# Patient Record
Sex: Male | Born: 1935 | Race: White | Hispanic: No | Marital: Married | Smoking: Never smoker
Health system: Southern US, Community
[De-identification: ages and names within clinical notes are randomized; demographics above are authoritative.]

## PROBLEM LIST (undated history)

## (undated) DIAGNOSIS — C801 Malignant (primary) neoplasm, unspecified: Secondary | ICD-10-CM

## (undated) DIAGNOSIS — Z83719 Family history of colon polyps, unspecified: Secondary | ICD-10-CM

## (undated) DIAGNOSIS — H4040X1 Glaucoma secondary to eye inflammation, unspecified eye, mild stage: Secondary | ICD-10-CM

## (undated) DIAGNOSIS — K219 Gastro-esophageal reflux disease without esophagitis: Secondary | ICD-10-CM

## (undated) DIAGNOSIS — H25019 Cortical age-related cataract, unspecified eye: Secondary | ICD-10-CM

## (undated) DIAGNOSIS — Z8371 Family history of colonic polyps: Secondary | ICD-10-CM

## (undated) DIAGNOSIS — I10 Essential (primary) hypertension: Secondary | ICD-10-CM

## (undated) DIAGNOSIS — E785 Hyperlipidemia, unspecified: Secondary | ICD-10-CM

## (undated) DIAGNOSIS — R7303 Prediabetes: Secondary | ICD-10-CM

## (undated) HISTORY — PX: EYE SURGERY: SHX253

---

## 2004-06-20 ENCOUNTER — Ambulatory Visit: Payer: Self-pay

## 2012-11-14 ENCOUNTER — Ambulatory Visit: Payer: Self-pay | Admitting: Internal Medicine

## 2014-01-27 ENCOUNTER — Ambulatory Visit: Payer: Self-pay | Admitting: General Practice

## 2014-01-27 LAB — BASIC METABOLIC PANEL
ANION GAP: 3 — AB (ref 7–16)
BUN: 16 mg/dL (ref 7–18)
CALCIUM: 8.8 mg/dL (ref 8.5–10.1)
CHLORIDE: 107 mmol/L (ref 98–107)
Co2: 32 mmol/L (ref 21–32)
Creatinine: 1.08 mg/dL (ref 0.60–1.30)
EGFR (African American): >60
EGFR (Non-African Amer.): >60
Glucose: 101 mg/dL — ABNORMAL HIGH (ref 65–99)
Osmolality: 284 (ref 275–301)
Potassium: 4.2 mmol/L (ref 3.5–5.1)
SODIUM: 142 mmol/L (ref 136–145)

## 2014-01-27 LAB — CBC
HCT: 43.9 % (ref 40.0–52.0)
HGB: 14.3 g/dL (ref 13.0–18.0)
MCH: 33 pg (ref 26.0–34.0)
MCHC: 32.6 g/dL (ref 32.0–36.0)
MCV: 101 fL — AB (ref 80–100)
PLATELETS: 248 10*3/uL (ref 150–440)
RBC: 4.33 10*6/uL — AB (ref 4.40–5.90)
RDW: 13.6 % (ref 11.5–14.5)
WBC: 6.8 10*3/uL (ref 3.8–10.6)

## 2014-01-27 LAB — URINALYSIS, COMPLETE
BILIRUBIN, UR: NEGATIVE
BLOOD: NEGATIVE
Bacteria: NONE SEEN
Glucose,UR: NEGATIVE mg/dL (ref 0–75)
Ketone: NEGATIVE
Leukocyte Esterase: NEGATIVE
NITRITE: NEGATIVE
Ph: 7 (ref 4.5–8.0)
Protein: NEGATIVE
SPECIFIC GRAVITY: 1.013 (ref 1.003–1.030)
Squamous Epithelial: <1

## 2014-01-27 LAB — MRSA PCR SCREENING

## 2014-01-27 LAB — APTT: Activated PTT: 29.7 secs (ref 23.6–35.9)

## 2014-01-27 LAB — PROTIME-INR
INR: 1
Prothrombin Time: 13.3 secs (ref 11.5–14.7)

## 2014-01-27 LAB — SEDIMENTATION RATE: ERYTHROCYTE SED RATE: 6 mm/h (ref 0–20)

## 2014-01-28 LAB — URINE CULTURE

## 2014-02-08 ENCOUNTER — Inpatient Hospital Stay: Payer: Self-pay | Admitting: General Practice

## 2014-02-08 HISTORY — PX: JOINT REPLACEMENT: SHX530

## 2014-02-09 LAB — BASIC METABOLIC PANEL
Anion Gap: 6 — ABNORMAL LOW (ref 7–16)
BUN: 10 mg/dL (ref 7–18)
CALCIUM: 7.7 mg/dL — AB (ref 8.5–10.1)
CO2: 28 mmol/L (ref 21–32)
CREATININE: 0.93 mg/dL (ref 0.60–1.30)
Chloride: 105 mmol/L (ref 98–107)
EGFR (Non-African Amer.): >60
GLUCOSE: 111 mg/dL — AB (ref 65–99)
Osmolality: 277 (ref 275–301)
POTASSIUM: 4.2 mmol/L (ref 3.5–5.1)
SODIUM: 139 mmol/L (ref 136–145)

## 2014-02-09 LAB — PLATELET COUNT: PLATELETS: 195 10*3/uL (ref 150–440)

## 2014-02-09 LAB — HEMOGLOBIN: HGB: 10.5 g/dL — AB (ref 13.0–18.0)

## 2014-02-10 LAB — BASIC METABOLIC PANEL
ANION GAP: 8 (ref 7–16)
BUN: 9 mg/dL (ref 7–18)
CHLORIDE: 103 mmol/L (ref 98–107)
CREATININE: 0.86 mg/dL (ref 0.60–1.30)
Calcium, Total: 7.8 mg/dL — ABNORMAL LOW (ref 8.5–10.1)
Co2: 28 mmol/L (ref 21–32)
EGFR (African American): >60
Glucose: 97 mg/dL (ref 65–99)
Osmolality: 276 (ref 275–301)
POTASSIUM: 3.6 mmol/L (ref 3.5–5.1)
Sodium: 139 mmol/L (ref 136–145)

## 2014-02-10 LAB — HEMOGLOBIN: HGB: 9.7 g/dL — ABNORMAL LOW (ref 13.0–18.0)

## 2014-02-12 LAB — PATHOLOGY REPORT

## 2014-02-14 ENCOUNTER — Emergency Department: Payer: Self-pay | Admitting: Emergency Medicine

## 2014-02-15 LAB — COMPREHENSIVE METABOLIC PANEL
Albumin: 2.2 g/dL — ABNORMAL LOW (ref 3.4–5.0)
Alkaline Phosphatase: 67 U/L
Anion Gap: 8 (ref 7–16)
BUN: 12 mg/dL (ref 7–18)
Bilirubin,Total: 0.6 mg/dL (ref 0.2–1.0)
Calcium, Total: 8 mg/dL — ABNORMAL LOW (ref 8.5–10.1)
Chloride: 105 mmol/L (ref 98–107)
Co2: 26 mmol/L (ref 21–32)
Creatinine: 0.89 mg/dL (ref 0.60–1.30)
EGFR (African American): >60
EGFR (Non-African Amer.): >60
Glucose: 107 mg/dL — ABNORMAL HIGH (ref 65–99)
Osmolality: 278 (ref 275–301)
Potassium: 3.8 mmol/L (ref 3.5–5.1)
SGOT(AST): 45 U/L — ABNORMAL HIGH (ref 15–37)
SGPT (ALT): 68 U/L — ABNORMAL HIGH
Sodium: 139 mmol/L (ref 136–145)
Total Protein: 6 g/dL — ABNORMAL LOW (ref 6.4–8.2)

## 2014-02-15 LAB — APTT: Activated PTT: 24.7 secs (ref 23.6–35.9)

## 2014-02-15 LAB — CBC
HCT: 29 % — ABNORMAL LOW (ref 40.0–52.0)
HGB: 9.5 g/dL — ABNORMAL LOW (ref 13.0–18.0)
MCH: 33 pg (ref 26.0–34.0)
MCHC: 32.8 g/dL (ref 32.0–36.0)
MCV: 100 fL (ref 80–100)
Platelet: 391 10*3/uL (ref 150–440)
RBC: 2.89 10*6/uL — AB (ref 4.40–5.90)
RDW: 13.3 % (ref 11.5–14.5)
WBC: 8.1 10*3/uL (ref 3.8–10.6)

## 2014-02-15 LAB — PROTIME-INR
INR: 1.1
PROTHROMBIN TIME: 13.7 s (ref 11.5–14.7)

## 2014-02-15 LAB — TROPONIN I: Troponin-I: <0.02 ng/mL

## 2014-02-16 ENCOUNTER — Encounter: Payer: Self-pay | Admitting: Internal Medicine

## 2014-02-28 ENCOUNTER — Encounter: Payer: Self-pay | Admitting: Internal Medicine

## 2014-03-30 ENCOUNTER — Encounter: Payer: Self-pay | Admitting: Internal Medicine

## 2014-04-30 ENCOUNTER — Encounter: Payer: Self-pay | Admitting: Internal Medicine

## 2014-05-31 ENCOUNTER — Encounter: Payer: Self-pay | Admitting: Internal Medicine

## 2014-06-29 ENCOUNTER — Encounter
Admit: 2014-06-29 | Disposition: A | Discharge status: Home / Self Care | Payer: Self-pay | Attending: Internal Medicine | Admitting: Internal Medicine

## 2014-07-30 ENCOUNTER — Encounter
Admit: 2014-07-30 | Disposition: A | Discharge status: Home / Self Care | Payer: Self-pay | Attending: Internal Medicine | Admitting: Internal Medicine

## 2014-08-21 NOTE — Discharge Summary (Signed)
PATIENT NAME:  Mario Baxter, Mario Baxter MR#:  [id number] DATE OF BIRTH:  12/03/1930  DATE OF ADMISSION:  02/08/2014 DATE OF DISCHARGE:  02/12/2014  ADMITTING DIAGNOSIS: Degenerative arthrosis of right hip.   DISCHARGE DIAGNOSES:  1. Degenerative arthrosis of right hip. 2. Inability to void.   HISTORY OF PRESENT ILLNESS: The patient is an 81 year old gentleman who has been followed at [facility] for progression of right hip pain. The pain was noted to be aggravated with weightbearing activities. He denied any radiation of pain down his leg, also had denied any bowel or bladder dysfunction. He had appreciated some crepitus with his range of motion as well as some decrease in his range of motion of the hip. He had attempted to remain active with an exercise routine involving both walking and swimming.   At the time of surgery, he was not using any ambulatory aid. The pain had progressed to the point that it was significantly interfering with his activities of daily living. X-rays taken in [facility] showed significant narrowing of the cartilage space with bone-on-bone articulation being noted superiorly. Subchondral sclerosis was noted as well as subchondral cyst formation. There was flattening of the superior aspect of the femoral head. He was noted to have osteophyte formation as well.   After discussion of the risks and benefits of surgical intervention, the patient expressed his understanding of the risks and benefits and agreed for plans for surgical intervention.   PROCEDURES PERFORMED:  1. Right total hip arthroplasty.  2. Autologous bone grafting of a large subchondral cyst in the acetabulum.   ANESTHESIA: Spinal.   IMPLANTS UTILIZED: DePuy 18-mm small stature AML femoral stem, 60-mm outer diameter Pinnacle 100 acetabular shell, a +4-mm 10-degree Pinnacle Marathon polyethylene liner, and a 36-mm M-Spec femoral head with a +8.5-mm neck length.   HOSPITAL COURSE: The patient  tolerated the procedure very well. He had no complications. He was then taken to the PACU where he was stabilized and then transferred to the orthopedic floor. The patient began receiving anticoagulation therapy of Lovenox 30 mg subcutaneous every 12 hours per anesthesia and pharmacy protocol. He was fitted with TED stockings bilaterally. These were allowed to be removed 1 hour per 8-hour shift. The patient was also fitted with AVI compression foot pumps bilaterally set at 80 mmHg. His calves have been nontender. There has been no evidence of any DVTs. Negative Homans sign. Heels were elevated off the bed using rolled towels.   The patient has denied any chest pain or shortness of breath. Vital signs have been stable. He has been afebrile. Hemodynamically, he is stable. No transfusions were given. The patient was noted to have inability to void after the catheter was removed. Subsequently, the Foley was reinserted. After some bladder training, this was discontinued and he was able to void along with the addition of Flomax to his pharmacy protocol.   Physical therapy was initiated on day 1 for gait training and transfers. He has done very well. He has been slow, but has progressed very well with no complications. Occupational therapy was also initiated on day 1 for ADLs and assistive devices.   The patient's IV, Foley and Hemovac were discontinued on day 1. As stated already, the Foley had to be reinserted. The wound was changed. There were no signs of any infection upon dressing change.   The patient was discharged to a skilled nursing facility in improved stable condition.   DISCHARGE INSTRUCTIONS: He may weight bear as tolerated. Continue  using rolling walker until cleared by physical therapy to go to a quad cane. He will receive OT and PT. Continue with TED stockings. These are allowed to be removed 1 hour per 8-hour shift. Elevate the heels off the bed. Posterior hip precautions were gone over.  Incentive spirometer q.1 hour while awake; encourage cough, deep breathing q.2 hours while awake. Change dressing as needed. Staples are to be removed on 02/22/2014. Apply benzoin and Steri-Strips. He has a followup appointment with [facility] on November 24 at 1:45. He is to call the clinic sooner if any complications.   DRUG ALLERGIES: No known drug allergies.   MEDICATIONS: Senokot-S 1 tablet b.i.d., multivitamin capsule 1 tablet daily, pantoprazole 40 mg daily, Altace 2.5 mg daily, Zocor 20 mg daily, Flomax 0.4 mg daily after food, Tylenol ES 500 to 1000 mg q.4-6h. p.r.n. for temperatures, Mylanta DS 30 mL q.6 hours p.r.n., Dulcolax suppository 10 mg rectally p.r.n. for constipation without results with Milk of Magnesia, Milk of Magnesia 30 mL b.i.d., Roxicodone 5 to 10 mg every 4 to 6 hours p.r.n. for pain, tramadol 50 to 100 mg every 4 to 6 hours p.r.n. for pain, enema with soapsuds if no results with Milk of Magnesia or Dulcolax.   PAST MEDICAL HISTORY:  1. Hyperlipidemia.  2. Early stage glaucoma.  3. Benign colonic polyps.    ____________________________ Vance Peper, PA [email or web address] D: 02/12/2014 16:32:19 ET T: 02/12/2014 16:52:06 ET JOB#: [id number]  cc: Vance Peper, PA, <Dictator> Savannaha Stonerock PA ELECTRONICALLY SIGNED 02/16/2014 12:35

## 2014-08-21 NOTE — Op Note (Signed)
PATIENT NAME:  Mario Baxter, Mario Baxter MR#:  [id number] DATE OF BIRTH:  Dec 31, 1930  DATE OF PROCEDURE:  02/08/2014  PREOPERATIVE DIAGNOSIS:  Degenerative arthrosis of the right hip.   POSTOPERATIVE DIAGNOSIS:  Degenerative arthrosis of the right hip.  PROCEDURE PERFORMED:   1.  Right total hip arthroplasty.  2.  Autologous bone grafting of large subchondral cyst in the acetabulum.   SURGEON:  Laurice Record. Holley Bouche., MD   ASSISTANT:  Vance Peper, PA (referred to maintain retraction throughout the procedure).   ANESTHESIA:  Spinal.   ESTIMATED BLOOD LOSS:  400 mL.   FLUIDS REPLACED:  1800 mL of crystalloid.   DRAINS:  Two medium drains to Hemovac reservoir.   IMPLANTS UTILIZED:  DePuy 18 mm small-stature AML femoral stem, 60 mm outer diameter Pinnacle 100 acetabular shell, +4 mm 10-degree Pinnacle Marathon polyethylene liner, and a 36 mm M-SPEC femoral head with a +8.5 mm neck length.   INDICATIONS FOR SURGERY:  The patient is an 81 year old gentleman who has been seen for complaints of progressive right hip and groin pain. X-rays demonstrated severe degenerative changes with full-thickness loss of articular cartilage. After discussion of the risks and benefits of surgical intervention, the patient expressed understanding of the risks, benefits, and agreed with plans for surgical intervention.   PROCEDURE IN DETAIL:  The patient was brought to the operating room, and after adequate spinal anesthesia was achieved, the patient was placed in a left lateral decubitus position. Axillary roll was placed, and all bony prominences were well padded. The patient's right hip and leg were cleaned and prepped with alcohol and DuraPrep, draped in the usual sterile fashion. A "timeout" was performed as per usual protocol. A lateral curvilinear incision was made gently curving towards the posterior superior iliac spine. IT band was incised in line with the skin incision, and fibers of the gluteus maximus were split in  line. Piriformis tendon was identified, skeletonized, and incised at its insertion at the proximal femur and reflected posteriorly. In a similar fashion, the short external rotators were incised and reflected posteriorly. A T-type posterior capsulotomy was performed. Prior to dislocation of the femoral head, a threaded Steinmann pin was inserted through a separate stab incision into the pelvis superior to the acetabulum and bent in the form of a stylus so as to assess limb length and hip offset throughout the procedure. The femoral head was then dislocated posteriorly. Inspection of the femoral head demonstrated severe degenerative changes with full-thickness loss of articular cartilage. A femoral neck cut was performed using an oscillating saw. The anterior capsule was elevated off of the femoral neck. Inspection of the acetabulum also demonstrated severe degenerative changes. Remnant of the labrum was excised. The acetabulum was reamed in a sequential fashion up to a 59 mm diameter. This allowed for excellent punctate bleeding bone. There was a large cyst superiorly. Gelatinous material and fibrotic tissue was removed using a curette. Cancellous bone from the femoral head as well as bony reamings were used to pack the lesion. The 59 mm reamer was placed on reverse, and the bone graft was impacted accordingly. A 60 mm outer diameter Pinnacle 100 acetabular component was positioned and impacted into place. Excellent scratch fit was appreciated. Anterior osteophytes were debrided from the acetabulum. A +4 mm neutral polyethylene was initially placed. Attention was then directed to the proximal tibia. Pilot hole for reaming of the proximal femoral canal was created using a high-speed bur. Proximal femoral canal was reamed in a sequential fashion  up to a 17.5 mm diameter. This allowed for approximately 6 cm of scratch fit. The proximal portion of the femur was then prepared using 18 mm aggressive side-biting reamer.  Serial broaches were inserted up to an 18 mm small-stature broach. The calcar region was planed, and trial reduction was performed using first a 5 and subsequently an 8.5 mm neck length. Reasonable good stability was appreciated. However, it was elected to trial with a +4 mm 10-degree trial liner with the high side positioned at approximately the 8 o'clock position. This allowed for exceptional posterior stability. There was good equalization of limb lengths appreciated. Trial components were removed. The acetabular shell was irrigated and suctioned dry. A +4 mm 10-degree Pinnacle Marathon polyethylene liner was positioned and impacted into place with the high side positioned at the 8 o'clock position. Next, an 18 mm small-stature AML femoral component was positioned and impacted into place. Excellent scratch fit was appreciated. Trial reduction was again performed with a +8.5 mm neck length. Again, good equalization of limb lengths and excellent stability was appreciated both anteriorly and posteriorly. The trial hip ball was removed, and the Morse taper was cleaned and dried. A 36 mm outer diameter M-SPEC femoral head with a +8.5 mm neck length was placed in trunnion and impacted into place. The hip was reduced and placed through a range of motion with excellent stability both anteriorly and posteriorly and good equalization of limb lengths. The wound was irrigated with copious amounts of normal saline with antibiotic solution using pulsatile lavage and then suctioned dry. Good hemostasis was appreciated. Two Hemovac drains were brought out through a separate stab incision to be attached to the Hemovac reservoir. Posterior capsulotomy was repaired using #5 Ethibond. The piriformis tendon was reapproximated on the surface of the gluteus medius tendon using #5 Ethibond. IT band was repaired using interrupted sutures of #1 Vicryl. The subcutaneous tissue was approximated in layers using first #0 Vicryl followed by  #2-0 Vicryl. The skin was closed with skin staples. A sterile dressing was applied.   The patient tolerated the procedure well. He was transported to the recovery room in stable condition.   ____________________________ Laurice Record. Holley Bouche., MD [email or web address] D: 02/09/2014 00:56:03 ET T: 02/09/2014 03:23:53 ET JOB#: [id number]  cc: Laurice Record. Holley Bouche., MD, <Dictator> Randol P Holley Bouche MD ELECTRONICALLY SIGNED 02/10/2014 6:51

## 2014-10-29 DEATH — deceased

## 2015-09-16 IMAGING — CT CT HEAD WITHOUT CONTRAST
2 series · 14 of 30 positions shown, 16 images · non-contrast
Comparison: None.

CLINICAL DATA: Patient became dizzy and fell down. Mild swelling
overt right temporal scalp. Initial encounter.

EXAM:
CT HEAD WITHOUT CONTRAST
TECHNIQUE: Contiguous axial images were obtained from the base of the skull
through the vertex without intravenous contrast.

[Series 2: head wo · axial · 0.42mm/px · z∈[-94,+6]mm · 6 of 28 slices shown, 8 images]
[im 4/28  brain]
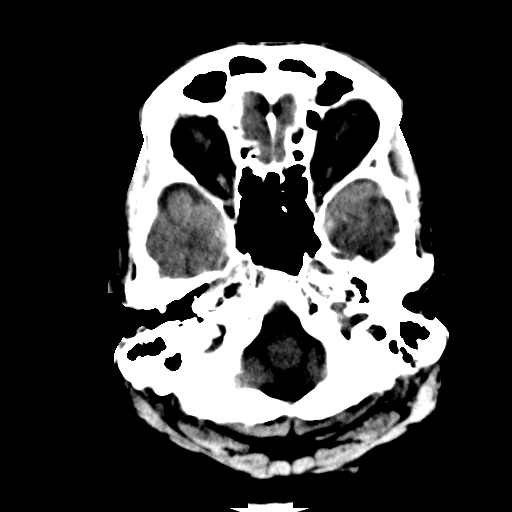
[im 4/28  bone]
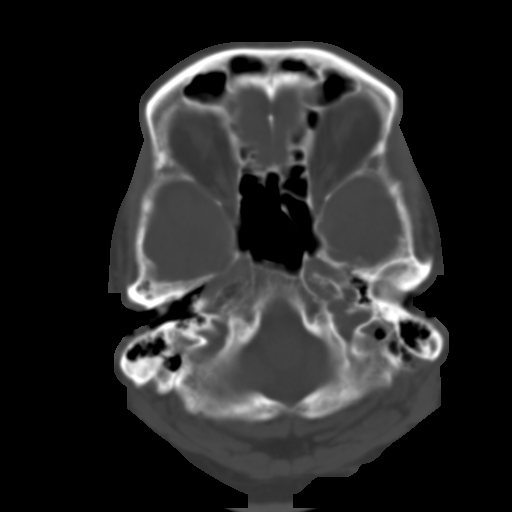
[im 8/28  brain]
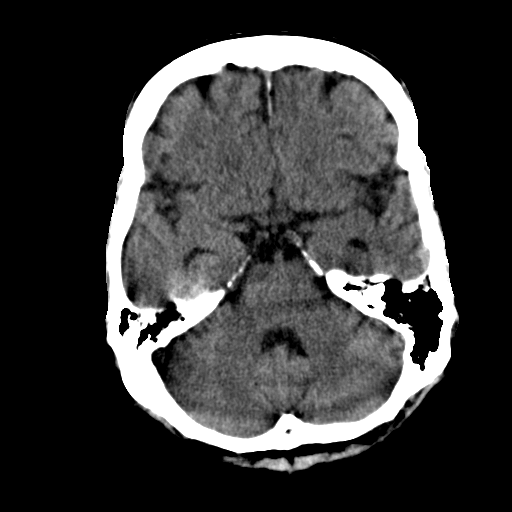
[im 12/28  brain]
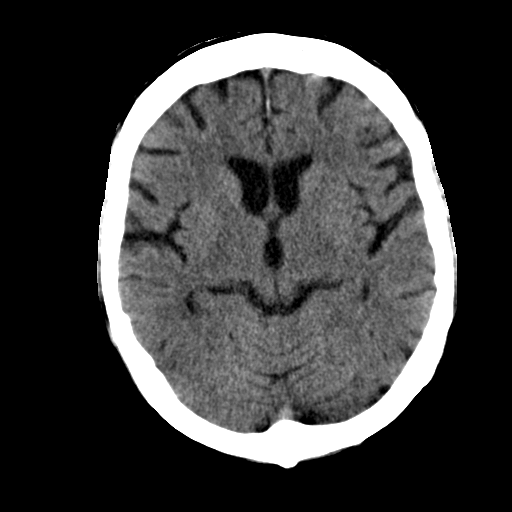
[im 16/28  brain]
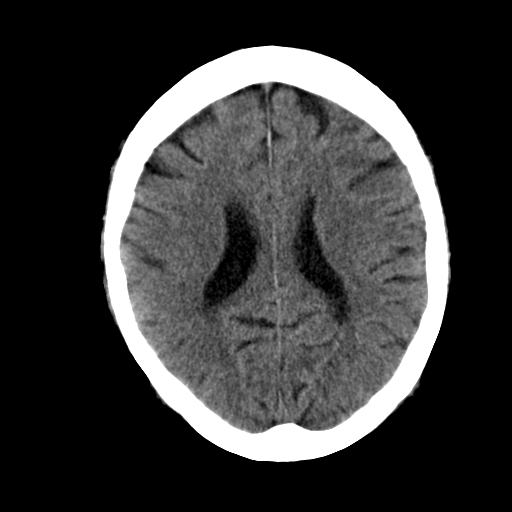
[im 20/28  brain]
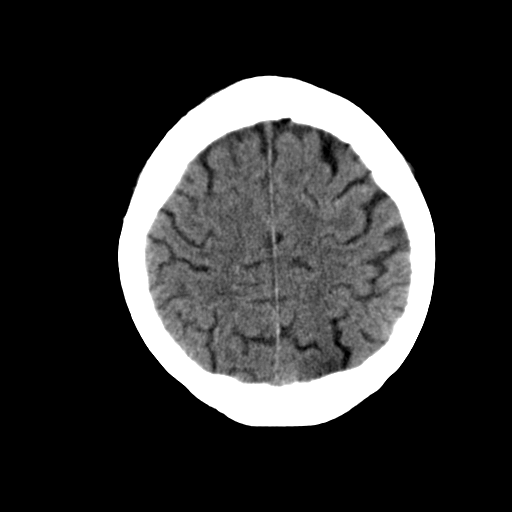
[im 20/28  bone]
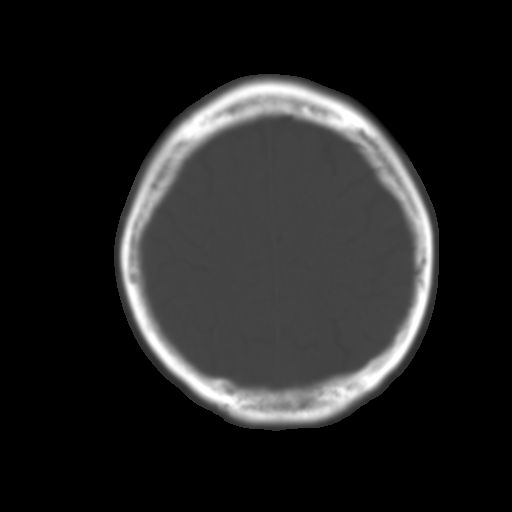
[im 24/28  brain]
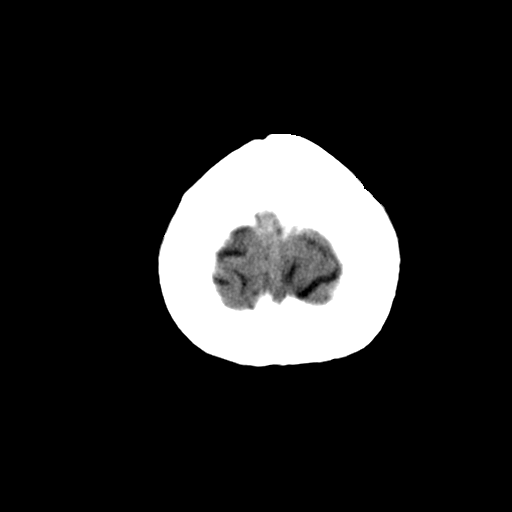

[Series 3: head bone · axial · 0.42mm/px · z∈[-105,+21]mm · 8 of 79 slices shown]
[im 8/79  bone]
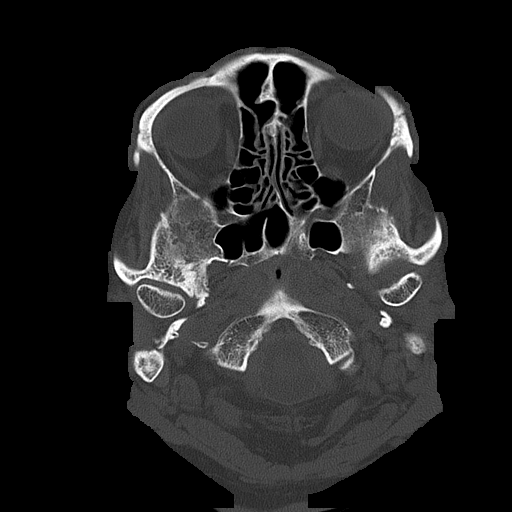
[im 15/79  bone]
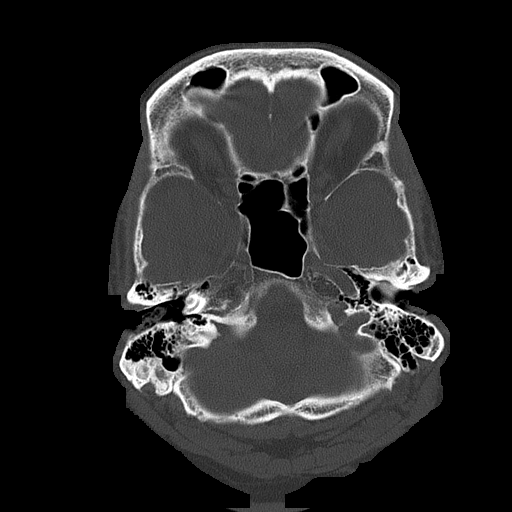
[im 27/79  bone]
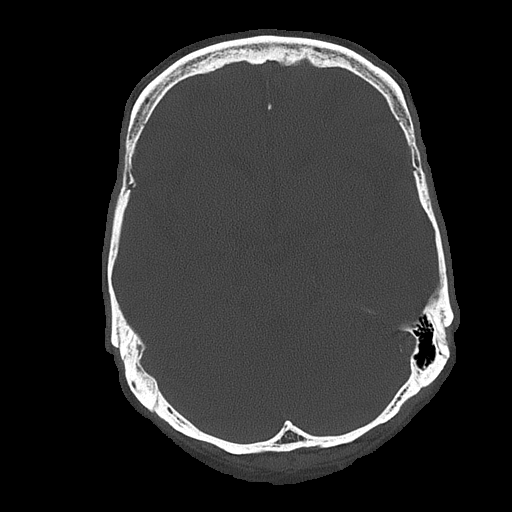
[im 34/79  bone]
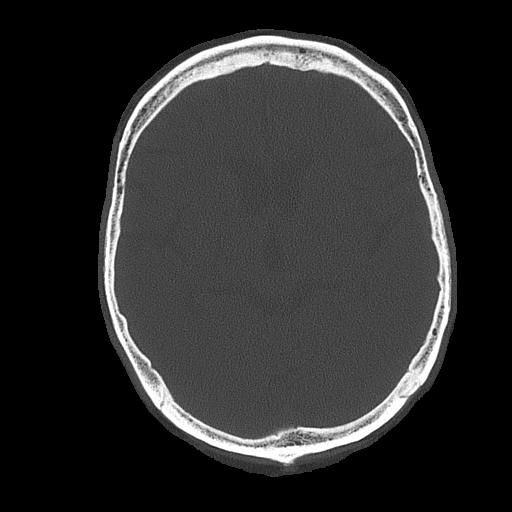
[im 45/79  bone]
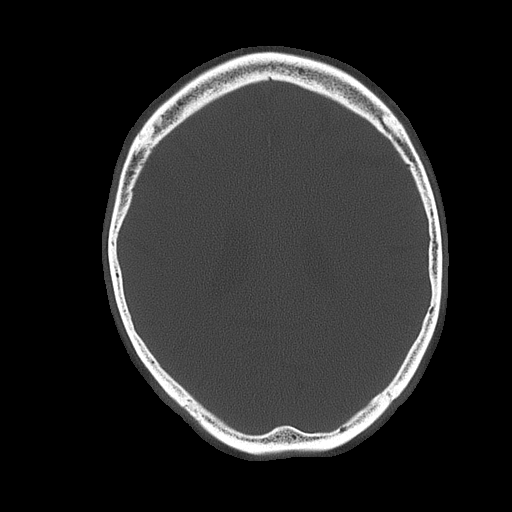
[im 53/79  bone]
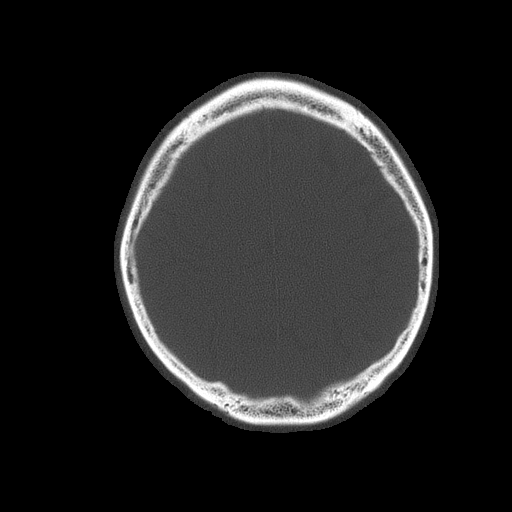
[im 64/79  bone]
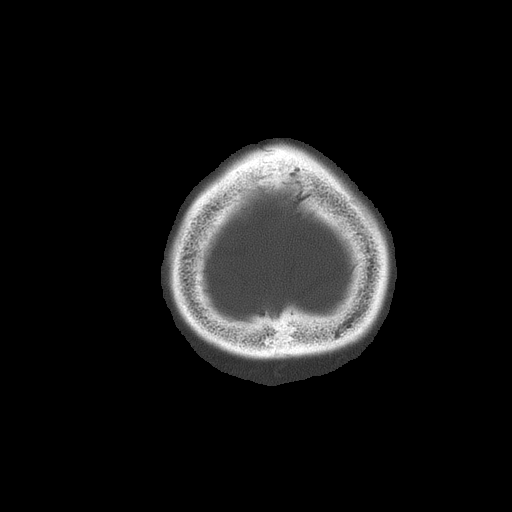
[im 71/79  bone]
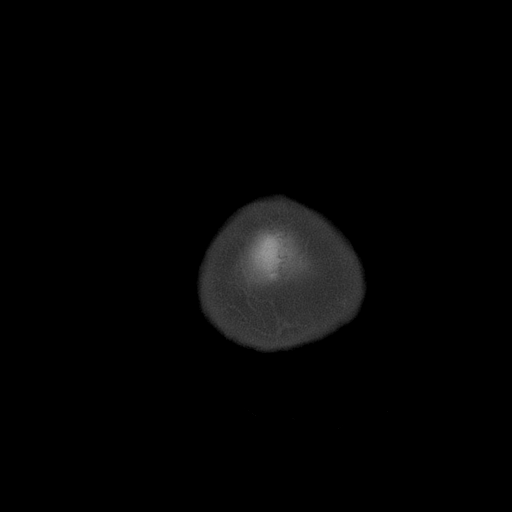

[14 of 30 positions shown; findings below may reference images not displayed]

FINDINGS: There is no evidence for acute hemorrhage, hydrocephalus, mass
lesion, or abnormal extra-axial fluid collection. No definite CT
evidence for acute infarction. Diffuse loss of parenchymal volume is
consistent with atrophy. The visualized paranasal sinuses and
mastoid air cells are clear.
IMPRESSION: No acute intracranial abnormality.

## 2016-09-10 ENCOUNTER — Encounter: Payer: Self-pay | Admitting: *Deleted

## 2016-09-11 ENCOUNTER — Encounter
Admission: RE | Disposition: A | Discharge status: Home / Self Care | Payer: Self-pay | Source: Ambulatory Visit | Attending: Gastroenterology

## 2016-09-11 ENCOUNTER — Ambulatory Visit: Payer: Medicare Other | Admitting: Anesthesiology

## 2016-09-11 ENCOUNTER — Ambulatory Visit
Admission: RE | Admit: 2016-09-11 | Discharge: 2016-09-11 | Disposition: A | Discharge status: Home / Self Care | Payer: Medicare Other | Source: Ambulatory Visit | Attending: Gastroenterology | Admitting: Gastroenterology

## 2016-09-11 ENCOUNTER — Encounter: Payer: Self-pay | Admitting: *Deleted

## 2016-09-11 DIAGNOSIS — D120 Benign neoplasm of cecum: Secondary | ICD-10-CM | POA: Diagnosis not present

## 2016-09-11 DIAGNOSIS — D128 Benign neoplasm of rectum: Secondary | ICD-10-CM | POA: Diagnosis not present

## 2016-09-11 DIAGNOSIS — K224 Dyskinesia of esophagus: Secondary | ICD-10-CM | POA: Insufficient documentation

## 2016-09-11 DIAGNOSIS — D123 Benign neoplasm of transverse colon: Secondary | ICD-10-CM | POA: Insufficient documentation

## 2016-09-11 DIAGNOSIS — E785 Hyperlipidemia, unspecified: Secondary | ICD-10-CM | POA: Diagnosis not present

## 2016-09-11 DIAGNOSIS — K319 Disease of stomach and duodenum, unspecified: Secondary | ICD-10-CM | POA: Diagnosis not present

## 2016-09-11 DIAGNOSIS — K449 Diaphragmatic hernia without obstruction or gangrene: Secondary | ICD-10-CM | POA: Diagnosis not present

## 2016-09-11 DIAGNOSIS — K621 Rectal polyp: Secondary | ICD-10-CM | POA: Diagnosis not present

## 2016-09-11 DIAGNOSIS — Z79899 Other long term (current) drug therapy: Secondary | ICD-10-CM | POA: Insufficient documentation

## 2016-09-11 DIAGNOSIS — K635 Polyp of colon: Secondary | ICD-10-CM | POA: Diagnosis not present

## 2016-09-11 DIAGNOSIS — K259 Gastric ulcer, unspecified as acute or chronic, without hemorrhage or perforation: Secondary | ICD-10-CM | POA: Diagnosis not present

## 2016-09-11 DIAGNOSIS — K5730 Diverticulosis of large intestine without perforation or abscess without bleeding: Secondary | ICD-10-CM | POA: Insufficient documentation

## 2016-09-11 DIAGNOSIS — D125 Benign neoplasm of sigmoid colon: Secondary | ICD-10-CM | POA: Diagnosis not present

## 2016-09-11 DIAGNOSIS — R195 Other fecal abnormalities: Secondary | ICD-10-CM | POA: Diagnosis not present

## 2016-09-11 HISTORY — PX: ESOPHAGOGASTRODUODENOSCOPY (EGD) WITH PROPOFOL: SHX5813

## 2016-09-11 HISTORY — DX: Glaucoma secondary to eye inflammation, unspecified eye, mild stage: H40.40X1

## 2016-09-11 HISTORY — DX: Cortical age-related cataract, unspecified eye: H25.019

## 2016-09-11 HISTORY — PX: COLONOSCOPY WITH PROPOFOL: SHX5780

## 2016-09-11 HISTORY — DX: Hyperlipidemia, unspecified: E78.5

## 2016-09-11 HISTORY — DX: Family history of colon polyps, unspecified: Z83.719

## 2016-09-11 HISTORY — DX: Family history of colonic polyps: Z83.71

## 2016-09-11 SURGERY — COLONOSCOPY WITH PROPOFOL
Anesthesia: General

## 2016-09-11 MED ORDER — SODIUM CHLORIDE 0.9 % IV SOLN
1.0000 g | Freq: Once | INTRAVENOUS | Status: AC
  Administered 2016-09-11: 1 g via INTRAVENOUS

## 2016-09-11 MED ORDER — LIDOCAINE HCL (CARDIAC) 20 MG/ML IV SOLN
INTRAVENOUS | Status: DC | PRN
  Administered 2016-09-11: 100 mg via INTRAVENOUS

## 2016-09-11 MED ORDER — EPHEDRINE SULFATE 50 MG/ML IJ SOLN
INTRAMUSCULAR | Status: DC | PRN
  Administered 2016-09-11: 10 mg via INTRAVENOUS

## 2016-09-11 MED ORDER — PROPOFOL 10 MG/ML IV BOLUS
INTRAVENOUS | Status: DC | PRN
  Administered 2016-09-11: 40 mg via INTRAVENOUS

## 2016-09-11 MED ORDER — PHENYLEPHRINE HCL 10 MG/ML IJ SOLN
INTRAMUSCULAR | Status: DC | PRN
  Administered 2016-09-11 (×2): 100 ug via INTRAVENOUS

## 2016-09-11 MED ORDER — PROPOFOL 500 MG/50ML IV EMUL
INTRAVENOUS | Status: AC
  Filled 2016-09-11: qty 50

## 2016-09-11 MED ORDER — GLYCOPYRROLATE 0.2 MG/ML IJ SOLN
INTRAMUSCULAR | Status: DC | PRN
  Administered 2016-09-11: 0.2 mg via INTRAVENOUS

## 2016-09-11 MED ORDER — LIDOCAINE HCL (CARDIAC) 20 MG/ML IV SOLN: INTRAVENOUS | Status: DC | PRN

## 2016-09-11 MED ORDER — PROPOFOL 10 MG/ML IV BOLUS
INTRAVENOUS | Status: AC
  Filled 2016-09-11: qty 20

## 2016-09-11 MED ORDER — PROPOFOL 500 MG/50ML IV EMUL
INTRAVENOUS | Status: DC | PRN
  Administered 2016-09-11: 150 ug/kg/min via INTRAVENOUS

## 2016-09-11 MED ORDER — LIDOCAINE HCL 2 % IJ SOLN
INTRAMUSCULAR | Status: AC
  Filled 2016-09-11: qty 10

## 2016-09-11 MED ORDER — SODIUM CHLORIDE 0.9 % IV SOLN: INTRAVENOUS | Status: DC

## 2016-09-11 MED ORDER — SODIUM CHLORIDE 0.9 % IV SOLN
INTRAVENOUS | Status: DC
  Administered 2016-09-11 (×3): via INTRAVENOUS

## 2016-09-11 NOTE — Anesthesia Post-op Follow-up Note (Cosign Needed)
Anesthesia QCDR form completed.        

## 2016-09-11 NOTE — Anesthesia Procedure Notes (Signed)
Date/Time: 09/11/2016 11:30 AM Performed by: Allean Found Pre-anesthesia Checklist: Patient identified, Emergency Drugs available, Suction available, Patient being monitored and Timeout performed Patient Re-evaluated:Patient Re-evaluated prior to inductionOxygen Delivery Method: Nasal cannula Placement Confirmation: positive ETCO2

## 2016-09-11 NOTE — Transfer of Care (Signed)
Immediate Anesthesia Transfer of Care Note  Patient: Mario Baxter  Procedure(s) Performed: Procedure(s): COLONOSCOPY WITH PROPOFOL (N/A) ESOPHAGOGASTRODUODENOSCOPY (EGD) WITH PROPOFOL (N/A)  Patient Location: PACU  Anesthesia Type:General  Level of Consciousness: sedated  Airway & Oxygen Therapy: Patient Spontanous Breathing and Patient connected to nasal cannula oxygen  Post-op Assessment: Report given to RN and Post -op Vital signs reviewed and stable  Post vital signs: Reviewed and stable  Last Vitals:  Vitals:   09/11/16 0957 09/11/16 1240  BP: (!) 146/88 (!) 104/54  Pulse: 66 65  Resp: 18 20  Temp: 36.2 C 36.1 C    Last Pain:  Vitals:   09/11/16 1240  TempSrc: Tympanic         Complications: No apparent anesthesia complications

## 2016-09-11 NOTE — OR Nursing (Signed)
The order is for Ampicillin 2 GM instead of 1 gM another Ampicillin 1 gm was given  in intraop.

## 2016-09-11 NOTE — H&P (Signed)
Outpatient short stay form Pre-procedure 09/11/2016 11:20 AM Mario Baxter  Primary Physician: Dr Elijio Miles  Reason for visit:  EGD and colonoscopy  History of present illness:  Patient is a 83 year old male presenting today as above. He was found on some routine testing to have Hemoccult-positive stool. He denies any GI symptoms such as change of bowel habits abdominal pain and diarrhea and nausea vomiting heartburn or dysphagia. He tolerated his prep well. He takes no aspirin or blood thinning agents.    Current Facility-Administered Medications:  .  0.9 %  sodium chloride infusion, , Intravenous, Continuous, Mario Sails, Baxter, Last Rate: 20 mL/hr at 09/11/16 1022 .  0.9 %  sodium chloride infusion, , Intravenous, Continuous, Mario Sails, Baxter  Prescriptions Prior to Admission  Medication Sig Dispense Refill Last Dose  . Multiple Vitamins-Minerals (CENTRUM SILVER 50+MEN) TABS Take 1 tablet by mouth daily.   Past Week at Unknown time  . omeprazole (PRILOSEC) 20 MG capsule Take 20 mg by mouth daily.   09/11/2016 at Unknown time  . ramipril (ALTACE) 2.5 MG capsule Take 2.5 mg by mouth daily.   09/11/2016 at Unknown time  . sildenafil (VIAGRA) 100 MG tablet Take 100 mg by mouth daily as needed for erectile dysfunction.   Past Week at Unknown time  . simvastatin (ZOCOR) 20 MG tablet Take 20 mg by mouth daily.   09/11/2016 at Unknown time  . doxycycline (VIBRA-TABS) 100 MG tablet Take 100 mg by mouth 2 (two) times daily.   Not Taking at Unknown time     No Known Allergies   Past Medical History:  Diagnosis Date  . Cataract cortical, senile   . FH: colon polyps   . 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 Hyperlipidemia     Review of systems:      Physical Exam    Heart and lungs: Regular rate and rhythm without rub or gallop, lungs are bilaterally clear.    HEENT: Normocephalic atraumatic eyes are anicteric    Other:     Pertinant exam for  procedure: Soft nontender nondistended bowel sounds positive normoactive.    Planned proceedures: EGD, colonoscopy and indicated procedures. I have discussed the risks benefits and complications of procedures to include not limited to bleeding, infection, perforation and the risk of sedation and the patient wishes to proceed.    Mario Sails, Baxter [facility] 09/11/2016  11:20 AM

## 2016-09-11 NOTE — Op Note (Signed)
[facility] Patient Name: Mario Baxter Procedure Date: 09/11/2016 11:14 AM MRN: [id number] Account #: [id number] Date of Birth: 06-03-1930 Admit Type: Outpatient Age: 83 Room: [facility] ENDO ROOM 3 Gender: Male Note Status: Finalized Procedure:            Colonoscopy Indications:          Heme positive stool Providers:            [name], MD Medicines:            Monitored Anesthesia Care Complications:        No immediate complications. Procedure:            Pre-Anesthesia Assessment:                       - ASA Grade Assessment: III - A patient with severe                        systemic disease.                       After obtaining informed consent, the colonoscope was                        passed under direct vision. Throughout the procedure,                        the patient's blood pressure, pulse, and oxygen                        saturations were monitored continuously. The                        Colonoscope was introduced through the anus and                        advanced to the the cecum, identified by appendiceal                        orifice and ileocecal valve. The colonoscopy was                        performed without difficulty. The patient tolerated the                        procedure well. The quality of the bowel preparation                        was fair. Findings:      A few small-mouthed diverticula were found in the sigmoid colon and       descending colon.      Two sessile polyps were found in the hepatic flexure. The polyps were 1       to 2 mm in size. These polyps were removed with a cold biopsy forceps.       Resection and retrieval were complete.      Two sessile polyps were found in the cecum. The polyps were 1 to 2 mm in       size. These polyps were removed with a cold biopsy forceps. Resection       and retrieval were complete.      Three sessile  polyps were found in the ileocecal valve. The polyps  were       less than 1 mm in size. These polyps were removed with a cold biopsy       forceps. Resection and retrieval were complete.      A 14 mm polyp was found in the proximal sigmoid colon. The polyp was       pedunculated. The polyp was removed with a hot snare. Resection and       retrieval were complete.      A 8 mm polyp was found in the rectum. The polyp was pedunculated. The       polyp was removed with a cold snare. Resection and retrieval were       complete.      Two sessile polyps were found in the rectum. The polyps were less than 1       mm in size. These polyps were removed with a cold biopsy forceps.       Resection and retrieval were complete.      The retroflexed view of the distal rectum and anal verge was normal and       showed no anal or rectal abnormalities.      The digital rectal exam was normal. Impression:           - Preparation of the colon was fair.                       - Diverticulosis in the sigmoid colon and in the                        descending colon.                       - Two 1 to 2 mm polyps at the hepatic flexure, removed                        with a cold biopsy forceps. Resected and retrieved.                       - Two 1 to 2 mm polyps in the cecum, removed with a                        cold biopsy forceps. Resected and retrieved.                       - Three less than 1 mm polyps at the ileocecal valve,                        removed with a cold biopsy forceps. Resected and                        retrieved.                       - One 14 mm polyp in the proximal sigmoid colon,                        removed with a hot snare. Resected and retrieved.                       - One 8 mm  polyp in the rectum, removed with a cold                        snare. Resected and retrieved.                       - Two less than 1 mm polyps in the rectum, removed with                        a cold biopsy forceps. Resected and retrieved.                        - The distal rectum and anal verge are normal on                        retroflexion view. Recommendation:       - Await pathology results.                       - Telephone GI clinic for pathology results in 1 week.                       - Return to GI clinic in 1 month.                       - consider VCE for continued heme positive stool Procedure Code(s):    --- Professional ---                       [phone], Colonoscopy, flexible; with removal of tumor(s),                        polyp(s), or other lesion(s) by snare technique                       45380, 29, Colonoscopy, flexible; with biopsy, single                        or multiple Diagnosis Code(s):    --- Professional ---                       D12.3, Benign neoplasm of transverse colon (hepatic                        flexure or splenic flexure)                       D12.0, Benign neoplasm of cecum                       K62.1, Rectal polyp                       D12.5, Benign neoplasm of sigmoid colon                       R19.5, Other fecal abnormalities                       K57.30, Diverticulosis of large intestine without  perforation or abscess without bleeding CPT copyright 2016 American Medical Association. All rights reserved. The codes documented in this report are preliminary and upon coder review may  be revised to meet current compliance requirements. [name], MD 09/11/2016 12:35:59 PM This report has been signed electronically. Number of Addenda: 0 Note Initiated On: 09/11/2016 11:14 AM Scope Withdrawal Time: 0 hours 14 minutes 19 seconds  Total Procedure Duration: 0 hours 29 minutes 53 seconds       [facility]

## 2016-09-11 NOTE — Anesthesia Preprocedure Evaluation (Signed)
Anesthesia Evaluation  Patient identified by MRN, date of birth, ID band Patient awake    Reviewed: Allergy & Precautions, H&P , NPO status , Patient's Chart, lab work & pertinent test results, reviewed documented beta blocker date and time   Airway Mallampati: II   Neck ROM: full    Dental  (+) Teeth Intact   Pulmonary neg pulmonary ROS,    Pulmonary exam normal        Cardiovascular negative cardio ROS Normal cardiovascular exam Rhythm:regular Rate:Normal     Neuro/Psych negative neurological ROS  negative psych ROS   GI/Hepatic negative GI ROS, Neg liver ROS,   Endo/Other  negative endocrine ROS  Renal/GU negative Renal ROS  negative genitourinary   Musculoskeletal   Abdominal   Peds  Hematology negative hematology ROS (+)   Anesthesia Other Findings Past Medical History: No date: Cataract cortical, senile No date: FH: colon polyps No date: Glaucoma secondary to eye inflammation, mild s* No date: Hyperlipidemia Past Surgical History: No date: EYE SURGERY Bilateral 02/08/2014: JOINT REPLACEMENT Right BMI    Body Mass Index:  26.55 kg/m     Reproductive/Obstetrics negative OB ROS                             Anesthesia Physical Anesthesia Plan  ASA: II  Anesthesia Plan: General   Post-op Pain Management:    Induction:   Airway Management Planned:   Additional Equipment:   Intra-op Plan:   Post-operative Plan:   Informed Consent: I have reviewed the patients History and Physical, chart, labs and discussed the procedure including the risks, benefits and alternatives for the proposed anesthesia with the patient or authorized representative who has indicated his/her understanding and acceptance.   Dental Advisory Given  Plan Discussed with: CRNA  Anesthesia Plan Comments:         Anesthesia Quick Evaluation

## 2016-09-11 NOTE — Op Note (Signed)
[facility] Gastroenterology Patient Name: Mario Baxter Procedure Date: 09/11/2016 11:14 AM MRN: [id number] Account #: [id number] Date of Birth: 1930-07-30 Admit Type: Outpatient Age: 83 Room: [facility] ENDO ROOM 3 Gender: Male Note Status: Finalized Procedure:            Upper GI endoscopy Indications:          Heme positive stool Providers:            [name], MD Referring MD:         Venetia Maxon. Elijio Miles, MD (Referring MD) Medicines:            Monitored Anesthesia Care Complications:        No immediate complications. Procedure:            Pre-Anesthesia Assessment:                       - ASA Grade Assessment: III - A patient with severe                        systemic disease.                       After obtaining informed consent, the endoscope was                        passed under direct vision. Throughout the procedure,                        the patient's blood pressure, pulse, and oxygen                        saturations were monitored continuously. The Endoscope                        was introduced through the mouth, and advanced to the                        third part of duodenum. The upper GI endoscopy was                        accomplished without difficulty. The patient tolerated                        the procedure. Findings:      Abnormal motility was noted in the middle third of the esophagus and in       the lower third of the esophagus. The cricopharyngeus was normal. There       are extra peristaltic waves in the esophageal body. The distal       esophagus/lower esophageal sphincter is open. Tertiary peristaltic waves       are noted.      The exam of the esophagus was otherwise normal.      A medium-sized hiatal hernia was found. The Z-line was a variable       distance from incisors; the hiatal hernia was sliding.      Patchy mild inflammation characterized by erosions and erythema was       found in the gastric body.      The  cardia and gastric fundus were normal on retroflexion otherwise.      The examined duodenum was normal. The  c-loop is fairly angulated. Impression:           - Abnormal esophageal motility, consistent with                        presbyesophagus.                       - Medium-sized hiatal hernia.                       - Erosive gastritis.                       - Normal examined duodenum.                       - No specimens collected. Recommendation:       - Await pathology results.                       - consider three month trial of PPI with repeat                        hemoccult. Procedure Code(s):    --- Professional ---                       [phone], Esophagogastroduodenoscopy, flexible, transoral;                        diagnostic, including collection of specimen(s) by                        brushing or washing, when performed (separate procedure) Diagnosis Code(s):    --- Professional ---                       K22.4, Dyskinesia of esophagus                       K44.9, Diaphragmatic hernia without obstruction or                        gangrene                       K29.60, Other gastritis without bleeding                       R19.5, Other fecal abnormalities CPT copyright 2016 [employer]. All rights reserved. The codes documented in this report are preliminary and upon coder review may  be revised to meet current compliance requirements. [name], MD 09/11/2016 11:58:17 AM This report has been signed electronically. Number of Addenda: 0 Note Initiated On: 09/11/2016 11:14 AM      [facility]

## 2016-09-12 ENCOUNTER — Encounter: Payer: Self-pay | Admitting: Gastroenterology

## 2016-09-12 NOTE — Anesthesia Postprocedure Evaluation (Signed)
Anesthesia Post Note  Patient: CORTAVIOUS NIX  Procedure(s) Performed: Procedure(s) (LRB): COLONOSCOPY WITH PROPOFOL (N/A) ESOPHAGOGASTRODUODENOSCOPY (EGD) WITH PROPOFOL (N/A)  Patient location during evaluation: PACU Anesthesia Type: General Level of consciousness: awake and alert Pain management: pain level controlled Vital Signs Assessment: post-procedure vital signs reviewed and stable Respiratory status: spontaneous breathing, nonlabored ventilation, respiratory function stable and patient connected to nasal cannula oxygen Cardiovascular status: blood pressure returned to baseline and stable Postop Assessment: no signs of nausea or vomiting Anesthetic complications: no     Last Vitals:  Vitals:   09/11/16 1240 09/11/16 1250  BP: (!) 104/54 116/69  Pulse: 65 77  Resp: 20 18  Temp: 36.1 C     Last Pain:  Vitals:   09/12/16 0749  TempSrc:   PainSc: 0-No pain                 Molli Barrows

## 2016-09-13 LAB — SURGICAL PATHOLOGY

## 2020-02-29 ENCOUNTER — Ambulatory Visit
Admission: RE | Admit: 2020-02-29 | Discharge: 2020-02-29 | Disposition: A | Discharge status: Home / Self Care | Payer: Medicare Other | Source: Ambulatory Visit | Attending: Internal Medicine | Admitting: Internal Medicine

## 2020-02-29 ENCOUNTER — Other Ambulatory Visit: Payer: Self-pay | Admitting: Internal Medicine

## 2020-02-29 ENCOUNTER — Other Ambulatory Visit: Payer: Self-pay

## 2020-02-29 DIAGNOSIS — M7989 Other specified soft tissue disorders: Secondary | ICD-10-CM

## 2020-02-29 DIAGNOSIS — S8992XA Unspecified injury of left lower leg, initial encounter: Secondary | ICD-10-CM

## 2021-06-02 ENCOUNTER — Encounter: Payer: Medicare Other | Attending: Physician Assistant | Admitting: Physician Assistant

## 2021-06-02 ENCOUNTER — Other Ambulatory Visit
Admission: RE | Admit: 2021-06-02 | Discharge: 2021-06-02 | Disposition: A | Discharge status: Home / Self Care | Payer: Medicare Other | Source: Ambulatory Visit | Attending: Physician Assistant | Admitting: Physician Assistant

## 2021-06-02 ENCOUNTER — Other Ambulatory Visit: Payer: Self-pay

## 2021-06-02 DIAGNOSIS — I872 Venous insufficiency (chronic) (peripheral): Secondary | ICD-10-CM | POA: Diagnosis not present

## 2021-06-02 DIAGNOSIS — I10 Essential (primary) hypertension: Secondary | ICD-10-CM | POA: Diagnosis not present

## 2021-06-02 DIAGNOSIS — H409 Unspecified glaucoma: Secondary | ICD-10-CM | POA: Diagnosis not present

## 2021-06-02 DIAGNOSIS — L97812 Non-pressure chronic ulcer of other part of right lower leg with fat layer exposed: Secondary | ICD-10-CM | POA: Insufficient documentation

## 2021-06-02 DIAGNOSIS — L03115 Cellulitis of right lower limb: Secondary | ICD-10-CM | POA: Insufficient documentation

## 2021-06-02 DIAGNOSIS — B999 Unspecified infectious disease: Secondary | ICD-10-CM | POA: Insufficient documentation

## 2021-06-06 NOTE — Progress Notes (Signed)
JAPHET, MORGENTHALER ([id number]) Visit Report for 06/02/2021 Abuse Risk Screen Details Patient Name: Mario Baxter, Mario Baxter. Date of Service: 06/02/2021 8:45 AM Medical Record Number: [id number] Patient Account Number: [id number] Date of Birth/Sex: 07-Apr-1931 (88 y.o. M) Treating RN: Cornell Barman Primary Care Lakoda Mcanany: Dorisann Frames Other Clinician: Referring Kermitt Harjo: Lamonte Sakai Treating Darlean Warmoth/Extender: Skipper Cliche in Treatment: 0 Abuse Risk Screen Items Answer ABUSE RISK SCREEN: Has anyone close to you tried to hurt or harm you recentlyo No Do you feel uncomfortable with anyone in your familyo No Has anyone forced you do things that you didnot want to doo No Electronic Signature(s) Signed: 06/06/2021 9:26:54 AM By: Gretta Cool, BSN, RN, CWS, Kim RN, BSN Entered By: Gretta Cool, BSN, RN, CWS, Kim on 06/02/2021 09:10:13 Mario Baxter ([id number]) -------------------------------------------------------------------------------- Activities of Daily Living Details Patient Name: Mario Baxter, Mario Baxter. Date of Service: 06/02/2021 8:45 AM Medical Record Number: [id number] Patient Account Number: [id number] Date of Birth/Sex: 02-19-1931 (88 y.o. M) Treating RN: Cornell Barman Primary Care Keyondre Hepburn: Dorisann Frames Other Clinician: Referring Lona Six: Lamonte Sakai Treating Selestino Nila/Extender: Skipper Cliche in Treatment: 0 Activities of Daily Living Items Answer Activities of Daily Living (Please select one for each item) Drive Automobile Completely Able Take Medications Completely Able Use Telephone Completely Able Care for Appearance Completely Able Use Toilet Completely Able Bath / Shower Completely Able Dress Self Completely Able Feed Self Completely Able Walk Completely Able Get In / Out Bed Completely Able Housework Completely Able Prepare Meals Completely Able Handle Money Completely Able Shop for Self Completely Able Electronic Signature(s) Signed: 06/06/2021 9:26:54 AM By: Gretta Cool, BSN, RN, CWS, Kim  RN, BSN Entered By: Gretta Cool, BSN, RN, CWS, Kim on 06/02/2021 09:10:23 Mario Baxter ([id number]) -------------------------------------------------------------------------------- Education Screening Details Patient Name: Mario Baxter, Mario Baxter. Date of Service: 06/02/2021 8:45 AM Medical Record Number: [id number] Patient Account Number: [id number] Date of Birth/Sex: 27-Aug-1930 (88 y.o. M) Treating RN: Cornell Barman Primary Care Tache Bobst: Dorisann Frames Other Clinician: Referring Genesee Nase: Lamonte Sakai Treating Layani Foronda/Extender: Skipper Cliche in Treatment: 0 Learning Preferences/Education Level/Primary Language Learning Preference: Explanation, Demonstration Highest Education Level: College or Above Preferred Language: English Cognitive Barrier Language Barrier: No Translator Needed: No Memory Deficit: No Emotional Barrier: No Cultural/Religious Beliefs Affecting Medical Care: No Physical Barrier Impaired Vision: No Impaired Hearing: No Decreased Hand dexterity: No Knowledge/Comprehension Knowledge Level: High Comprehension Level: High Ability to understand written instructions: High Ability to understand verbal instructions: High Motivation Anxiety Level: Calm Cooperation: Cooperative Education Importance: Acknowledges Need Interest in Health Problems: Asks Questions Perception: Coherent Willingness to Engage in Self-Management High Activities: Readiness to Engage in Self-Management High Activities: Engineer, maintenance) Signed: 06/06/2021 9:26:54 AM By: Gretta Cool, BSN, RN, CWS, Kim RN, BSN Entered By: Gretta Cool, BSN, RN, CWS, Kim on 06/02/2021 09:10:48 Mario Baxter ([id number]) -------------------------------------------------------------------------------- Fall Risk Assessment Details Patient Name: Mario Baxter. Date of Service: 06/02/2021 8:45 AM Medical Record Number: [id number] Patient Account Number: [id number] Date of Birth/Sex: [birth year] (88 y.o. M) Treating RN: Cornell Barman Primary Care Devonna Oboyle: Dorisann Frames Other Clinician: Referring Shawntez Dickison: Lamonte Sakai Treating Ngoc Daughtridge/Extender: Skipper Cliche in Treatment: 0 Fall Risk Assessment Items Have you had 2 or more falls in the last 12 monthso 0 No Have you had any fall that resulted in injury in the last 12 monthso 0 No FALLS RISK SCREEN History of falling - immediate or within 3 months 0 No Secondary diagnosis (Do you have 2 or more medical diagnoseso) 0 No Ambulatory aid None/bed rest/wheelchair/nurse 0 Yes Crutches/cane/walker 0 No Furniture 0  No Intravenous therapy Access/Saline/Heparin Lock 0 No Gait/Transferring Normal/ bed rest/ wheelchair 0 Yes Weak (short steps with or without shuffle, stooped but able to lift head while walking, may 0 No seek support from furniture) Impaired (short steps with shuffle, may have difficulty arising from chair, head down, impaired 0 No balance) Mental Status Oriented to own ability 0 Yes Electronic Signature(s) Signed: 06/06/2021 9:26:54 AM By: Gretta Cool, BSN, RN, CWS, Kim RN, BSN Entered By: Gretta Cool, BSN, RN, CWS, Kim on 06/02/2021 09:11:11 Mario Baxter ([id number]) -------------------------------------------------------------------------------- Foot Assessment Details Patient Name: Mario Baxter, Mario Baxter. Date of Service: 06/02/2021 8:45 AM Medical Record Number: [id number] Patient Account Number: [id number] Date of Birth/Sex: 1930-09-03 (88 y.o. M) Treating RN: Cornell Barman Primary Care Hasana Alcorta: Dorisann Frames Other Clinician: Referring Kadijah Shamoon: Lamonte Sakai Treating Makynleigh Breslin/Extender: Skipper Cliche in Treatment: 0 Foot Assessment Items Site Locations + = Sensation present, - = Sensation absent, C = Callus, U = Ulcer R = Redness, W = Warmth, M = Maceration, PU = Pre-ulcerative lesion F = Fissure, S = Swelling, D = Dryness Assessment Right: Left: Other Deformity: No No Prior Foot Ulcer: No No Prior Amputation: No No Charcot Joint: No  No Ambulatory Status: Ambulatory Without Help Gait: Steady Electronic Signature(s) Signed: 06/06/2021 9:26:54 AM By: Gretta Cool, BSN, RN, CWS, Kim RN, BSN Entered By: Gretta Cool, BSN, RN, CWS, Kim on 06/02/2021 09:18:49 Mario Baxter ([id number]) -------------------------------------------------------------------------------- Nutrition Risk Screening Details Patient Name: Mario Baxter, Mario Baxter. Date of Service: 06/02/2021 8:45 AM Medical Record Number: [id number] Patient Account Number: [id number] Date of Birth/Sex: 1931-04-16 (88 y.o. M) Treating RN: Cornell Barman Primary Care Caleah Tortorelli: Dorisann Frames Other Clinician: Referring Amisha Pospisil: Lamonte Sakai Treating Careena Degraffenreid/Extender: Skipper Cliche in Treatment: 0 Height (in): 72 Weight (lbs): 215 Body Mass Index (BMI): 29.2 Nutrition Risk Screening Items Score Screening NUTRITION RISK SCREEN: I have an illness or condition that made me change the kind and/or amount of food I eat 0 No I eat fewer than two meals per day 0 No I eat few fruits and vegetables, or milk products 0 No I have three or more drinks of beer, liquor or wine almost every day 0 No I have tooth or mouth problems that make it hard for me to eat 0 No I don't always have enough money to buy the food I need 0 No I eat alone most of the time 0 No I take three or more different prescribed or over-the-counter drugs a day 1 Yes Without wanting to, I have lost or gained 10 pounds in the last six months 0 No I am not always physically able to shop, cook and/or feed myself 0 No Nutrition Protocols Good Risk Protocol 0 No interventions needed Moderate Risk Protocol High Risk Proctocol Risk Level: Good Risk Score: 1 Electronic Signature(s) Signed: 06/06/2021 9:26:54 AM By: Gretta Cool, BSN, RN, CWS, Kim RN, BSN Entered By: Gretta Cool, BSN, RN, CWS, Kim on 06/02/2021 09:11:46

## 2021-06-06 NOTE — Progress Notes (Signed)
[name], [name] ([id number]) Visit Report for 06/02/2021 Allergy List Details Patient Name: Mario Baxter, Mario Baxter. Date of Service: 06/02/2021 8:45 AM Medical Record Number: [id number] Patient Account Number: [id number] Date of Birth/Sex: 1930-05-08 (88 y.o. M) Treating RN: Cornell Barman Primary Care Javari Bufkin: Dorisann Frames Other Clinician: Referring Aislynn Cifelli: Lamonte Sakai Treating Deleah Tison/Extender: Skipper Cliche in Treatment: 0 Allergies Active Allergies No Known Drug Allergies Allergy Notes Electronic Signature(s) Signed: 06/06/2021 9:26:54 AM By: Gretta Cool, BSN, RN, CWS, Kim RN, BSN Entered By: Gretta Cool, BSN, RN, CWS, Kim on 06/02/2021 09:03:53 [name] ([id number]) -------------------------------------------------------------------------------- Arrival Information Details Patient Name: Mario Baxter Date of Service: 06/02/2021 8:45 AM Medical Record Number: [id number] Patient Account Number: [id number] Date of Birth/Sex: 1930-05-08 (88 y.o. M) Treating RN: Cornell Barman Primary Care Revan Gendron: Dorisann Frames Other Clinician: Referring Morrigan Wickens: Lamonte Sakai Treating Margarito Dehaas/Extender: Skipper Cliche in Treatment: 0 Visit Information Patient Arrived: Ambulatory Arrival Time: 09:00 Accompanied By: wife Transfer Assistance: None Patient Identification Verified: Yes Secondary Verification Process Completed: Yes Patient Requires Transmission-Based No Precautions: Patient Has Alerts: Yes Patient Alerts: Borderline DM diet cont. Electronic Signature(s) Signed: 06/06/2021 9:26:54 AM By: Gretta Cool, BSN, RN, CWS, Kim RN, BSN Entered By: Gretta Cool, BSN, RN, CWS, Kim on 06/02/2021 09:02:07 [name] ([id number]) -------------------------------------------------------------------------------- Clinic Level of Care Assessment Details Patient Name: Mario Baxter. Date of Service: 06/02/2021 8:45 AM Medical Record Number: [id number] Patient Account Number: [id number] Date of Birth/Sex:  [birth year] (88 y.o. M) Treating RN: Cornell Barman Primary Care Natalina Wieting: Dorisann Frames Other Clinician: Referring Jaasia Viglione: Lamonte Sakai Treating Aashish Hamm/Extender: Skipper Cliche in Treatment: 0 Clinic Level of Care Assessment Items TOOL 1 Quantity Score []  - Use when EandM and Procedure is performed on INITIAL visit 0 ASSESSMENTS - Nursing Assessment / Reassessment X - General Physical Exam (combine w/ comprehensive assessment (listed just below) when performed on new 1 20 pt. evals) X- 1 25 Comprehensive Assessment (HX, ROS, Risk Assessments, Wounds Hx, etc.) ASSESSMENTS - Wound and Skin Assessment / Reassessment []  - Dermatologic / Skin Assessment (not related to wound area) 0 ASSESSMENTS - Ostomy and/or Continence Assessment and Care []  - Incontinence Assessment and Management 0 []  - 0 Ostomy Care Assessment and Management (repouching, etc.) PROCESS - Coordination of Care X - Simple Patient / Family Education for ongoing care 1 15 []  - 0 Complex (extensive) Patient / Family Education for ongoing care X- 1 10 Staff obtains Programmer, systems, Records, Test Results / Process Orders []  - 0 Staff telephones HHA, Nursing Homes / Clarify orders / etc []  - 0 Routine Transfer to another Facility (non-emergent condition) []  - 0 Routine Hospital Admission (non-emergent condition) X- 1 15 New Admissions / Biomedical engineer / Ordering NPWT, Apligraf, etc. []  - 0 Emergency Hospital Admission (emergent condition) PROCESS - Special Needs []  - Pediatric / Minor Patient Management 0 []  - 0 Isolation Patient Management []  - 0 Hearing / Language / Visual special needs []  - 0 Assessment of Community assistance (transportation, D/C planning, etc.) []  - 0 Additional assistance / Altered mentation []  - 0 Support Surface(s) Assessment (bed, cushion, seat, etc.) INTERVENTIONS - Miscellaneous []  - External ear exam 0 []  - 0 Patient Transfer (multiple staff / Civil Service fast streamer / Similar  devices) []  - 0 Simple Staple / Suture removal (25 or less) []  - 0 Complex Staple / Suture removal (26 or more) []  - 0 Hypo/Hyperglycemic Management (do not check if billed separately) X- 1 15 Ankle / Brachial Index (ABI) - do not check if billed separately  Has the patient been seen at the hospital within the last three years: Yes Total Score: 100 Level Of Care: New/Established - [address] [place]IST[place], RAUF ([id number]) Electronic Signature(s) Signed: 06/06/2021 9:26:54 AM By: Gretta Cool, BSN, RN, CWS, Kim RN, BSN Entered By: Gretta Cool, BSN, RN, CWS, Kim on 06/02/2021 10:23:04 [name] ([id number]) -------------------------------------------------------------------------------- Lower Extremity Assessment Details Patient Name: Mario Baxter. Date of Service: 06/02/2021 8:45 AM Medical Record Number: [id number] Patient Account Number: [id number] Date of Birth/Sex: [birth year] (88 y.o. M) Treating RN: Cornell Barman Primary Care Terri Rorrer: Dorisann Frames Other Clinician: Referring Marlyn Tondreau: Lamonte Sakai Treating Hamza Empson/Extender: Skipper Cliche in Treatment: 0 Edema Assessment Assessed: [Left: No] [Right: No] [Left: Edema] [Right: :] Calf Left: Right: Point of Measurement: 34 cm From Medial Instep 35.6 cm 36.4 cm Ankle Left: Right: Point of Measurement: 13 cm From Medial Instep 25 cm 25 cm Knee To Floor Left: Right: From Medial Instep 48 cm 48 cm Vascular Assessment Pulses: Dorsalis Pedis Palpable: [Left:Yes] [Right:Yes] Posterior Tibial Palpable: [Left:Yes] [Right:Yes] Blood Pressure: Brachial: [Left:80] Dorsalis Pedis: 170 [Left:Dorsalis Pedis: 150] Ankle: Posterior Tibial: 150 [Left:Posterior Tibial: 150 2.13] [Right:1.88] Electronic Signature(s) Signed: 06/06/2021 9:26:54 AM By: Gretta Cool, BSN, RN, CWS, Kim RN, BSN Entered By: Gretta Cool, BSN, RN, CWS, Kim on 06/02/2021 09:32:01 [name]  ([id number]) -------------------------------------------------------------------------------- Multi Wound Chart Details Patient Name: Mario Baxter. Date of Service: 06/02/2021 8:45 AM Medical Record Number: [id number] Patient Account Number: [id number] Date of Birth/Sex: 15-May-1930 (88 y.o. M) Treating RN: Cornell Barman Primary Care Ngai Parcell: Dorisann Frames Other Clinician: Referring Nialah Saravia: Lamonte Sakai Treating Annaliesa Blann/Extender: Skipper Cliche in Treatment: 0 Vital Signs Height(in): 72 Pulse(bpm): 73 Weight(lbs): 215 Blood Pressure(mmHg): 128/62 Body Mass Index(BMI): 29.2 Temperature(F): 97.9 Respiratory Rate(breaths/min): 16 Photos: [N/A:N/A] Wound Location: Right, Midline, Anterior Lower Leg N/A N/A Wounding Event: Trauma N/A N/A Primary Etiology: Venous Leg Ulcer N/A N/A Comorbid History: Cataracts, Glaucoma N/A N/A Date Acquired: 05/22/2021 N/A N/A Weeks of Treatment: 0 N/A N/A Wound Status: Open N/A N/A Wound Recurrence: No N/A N/A Measurements L x W x D (cm) 3.6x1.7x0.1 N/A N/A Area (cm) : 4.807 N/A N/A Volume (cm) : 0.481 N/A N/A % Reduction in Area: 0.00% N/A N/A % Reduction in Volume: 0.00% N/A N/A Classification: Full Thickness Without Exposed N/A N/A Support Structures Exudate Amount: Medium N/A N/A Exudate Type: Serous N/A N/A Exudate Color: amber N/A N/A Wound Margin: Flat and Intact N/A N/A Granulation Amount: Small (1-33%) N/A N/A Granulation Quality: Red N/A N/A Necrotic Amount: Large (67-100%) N/A N/A Necrotic Tissue: Eschar, Adherent Slough N/A N/A Epithelialization: None N/A N/A Treatment Notes Electronic Signature(s) Signed: 06/06/2021 9:26:54 AM By: Gretta Cool, BSN, RN, CWS, Kim RN, BSN Entered By: Gretta Cool, BSN, RN, CWS, Kim on 06/02/2021 09:38:24 [name] ([id number]) -------------------------------------------------------------------------------- Multi-Disciplinary Care Plan Details Patient Name: YAVUZ, KIRBY. Date of Service:  06/02/2021 8:45 AM Medical Record Number: [id number] Patient Account Number: [id number] Date of Birth/Sex: [birth year] (88 y.o. M) Treating RN: Cornell Barman Primary Care Aedan Geimer: Dorisann Frames Other Clinician: Referring Emmery Seiler: Lamonte Sakai Treating Joley Utecht/Extender: Skipper Cliche in Treatment: 0 Active Inactive Necrotic Tissue Nursing Diagnoses: Impaired tissue integrity related to necrotic/devitalized tissue Knowledge deficit related to management of necrotic/devitalized tissue Goals: Necrotic/devitalized tissue will be minimized in the wound bed Date Initiated: 06/02/2021 Target Resolution Date: 06/02/2021 Goal Status: Active Patient/caregiver will verbalize understanding of reason and process for debridement of necrotic tissue Date Initiated: 06/02/2021 Target Resolution Date: 06/02/2021 Goal Status: Active Interventions: Assess patient pain level pre-, during and  post procedure and prior to discharge Provide education on necrotic tissue and debridement process Treatment Activities: Excisional debridement : 06/02/2021 Notes: Orientation to the Wound Care Program Nursing Diagnoses: Knowledge deficit related to the wound healing center program Goals: Patient/caregiver will verbalize understanding of the [place] Program Date Initiated: 06/02/2021 Target Resolution Date: 06/02/2021 Goal Status: Active Interventions: Provide education on orientation to the wound center Notes: Venous Leg Ulcer Nursing Diagnoses: Actual venous Insuffiency (use after diagnosis is confirmed) Knowledge deficit related to disease process and management Potential for venous Insuffiency (use before diagnosis confirmed) Goals: Patient will maintain optimal edema control Date Initiated: 06/02/2021 Target Resolution Date: 06/02/2021 Goal Status: Active Patient/caregiver will verbalize understanding of disease process and disease management Date Initiated: 06/02/2021 Target Resolution Date:  06/02/2021 Goal Status: Active Verify adequate tissue perfusion prior to therapeutic compression application Date Initiated: 06/02/2021 Target Resolution Date: 06/02/2021 ERVIN, HENSLEY ([id number]) Goal Status: Active Interventions: Assess peripheral edema status every visit. Compression as ordered Provide education on venous insufficiency Notes: Wound/Skin Impairment Nursing Diagnoses: Impaired tissue integrity Goals: Patient/caregiver will verbalize understanding of skin care regimen Date Initiated: 06/02/2021 Target Resolution Date: 06/02/2021 Goal Status: Active Ulcer/skin breakdown will have a volume reduction of 30% by week 4 Date Initiated: 06/02/2021 Target Resolution Date: 06/02/2021 Goal Status: Active Interventions: Assess patient/caregiver ability to obtain necessary supplies Assess patient/caregiver ability to perform ulcer/skin care regimen upon admission and as needed Assess ulceration(s) every visit Treatment Activities: Referred to DME Kyrsten Deleeuw for dressing supplies : 06/02/2021 Skin care regimen initiated : 06/02/2021 Topical wound management initiated : 06/02/2021 Notes: Electronic Signature(s) Signed: 06/06/2021 9:26:54 AM By: Gretta Cool, BSN, RN, CWS, Kim RN, BSN Entered By: Gretta Cool, BSN, RN, CWS, Kim on 06/02/2021 09:38:06 [name] ([id number]) -------------------------------------------------------------------------------- Pain Assessment Details Patient Name: CAVON, NICOLLS. Date of Service: 06/02/2021 8:45 AM Medical Record Number: [id number] Patient Account Number: [id number] Date of Birth/Sex: Mar 14, 1931 (88 y.o. M) Treating RN: Cornell Barman Primary Care Nickalos Petersen: Dorisann Frames Other Clinician: Referring Rayshell Goecke: Lamonte Sakai Treating Nabeeha Badertscher/Extender: Skipper Cliche in Treatment: 0 Active Problems Location of Pain Severity and Description of Pain Patient Has Paino No Site Locations Pain Management and Medication Current Pain Management: Electronic  Signature(s) Signed: 06/06/2021 9:26:54 AM By: Gretta Cool, BSN, RN, CWS, Kim RN, BSN Entered By: Gretta Cool, BSN, RN, CWS, Kim on 06/02/2021 09:02:13 [name] ([id number]) -------------------------------------------------------------------------------- Wound Assessment Details Patient Name: JERAY, SHUGART. Date of Service: 06/02/2021 8:45 AM Medical Record Number: [id number] Patient Account Number: [id number] Date of Birth/Sex: 11/17/1930 (88 y.o. M) Treating RN: Cornell Barman Primary Care Rick Warnick: Dorisann Frames Other Clinician: Referring Deshayla Empson: Lamonte Sakai Treating Prentiss Hammett/Extender: Skipper Cliche in Treatment: 0 Wound Status Wound Number: 1 Primary Etiology: Venous Leg Ulcer Wound Location: Right, Midline, Anterior Lower Leg Wound Status: Open Wounding Event: Trauma Comorbid History: Cataracts, Glaucoma Date Acquired: 05/22/2021 Weeks Of Treatment: 0 Clustered Wound: No Photos Wound Measurements Length: (cm) 3.6 Width: (cm) 1.7 Depth: (cm) 0.1 Area: (cm) 4.807 Volume: (cm) 0.481 % Reduction in Area: 0% % Reduction in Volume: 0% Epithelialization: None Tunneling: No Undermining: No Wound Description Classification: Full Thickness Without Exposed Support Structu Wound Margin: Flat and Intact Exudate Amount: Medium Exudate Type: Serous Exudate Color: amber res Foul Odor After Cleansing: No Slough/Fibrino Yes Wound Bed Granulation Amount: Small (1-33%) Granulation Quality: Red Necrotic Amount: Large (67-100%) Necrotic Quality: Eschar, Adherent Therapist, music) Signed: 06/06/2021 9:26:54 AM By: Gretta Cool, BSN, RN, CWS, Kim RN, BSN Entered By: Gretta Cool, BSN, RN, CWS, Kim on  06/02/2021 09:34:56 LYNFORD, ESPINOZA ([id number]) -------------------------------------------------------------------------------- Vitals Details Patient Name: TEGH, FRANEK. Date of Service: 06/02/2021 8:45 AM Medical Record Number: [id number] Patient Account Number: [id number] Date of  Birth/Sex: 05/17/1930 (88 y.o. M) Treating RN: Cornell Barman Primary Care Mariona Scholes: Dorisann Frames Other Clinician: Referring Anaaya Fuster: Lamonte Sakai Treating Sherene Plancarte/Extender: Skipper Cliche in Treatment: 0 Vital Signs Time Taken: 09:02 Temperature (F): 97.9 Height (in): 72 Pulse (bpm): 73 Weight (lbs): 215 Respiratory Rate (breaths/min): 16 Body Mass Index (BMI): 29.2 Blood Pressure (mmHg): 128/62 Reference Range: 80 - 120 mg / dl Electronic Signature(s) Signed: 06/06/2021 9:26:54 AM By: Gretta Cool, BSN, RN, CWS, Kim RN, BSN Entered By: Gretta Cool, BSN, RN, CWS, Kim on 06/02/2021 09:03:13

## 2021-06-06 NOTE — Progress Notes (Signed)
JOHNATHYN, VISCOMI ([id number]) Visit Report for 06/02/2021 Chief Complaint Document Details Patient Name: Mario Baxter, Mario Baxter. Date of Service: 06/02/2021 8:45 AM Medical Record Number: [id number] Patient Account Number: [id number] Date of Birth/Sex: December 17, 1930 (88 y.o. M) Treating RN: Cornell Barman Primary Care Provider: Dorisann Frames Other Clinician: Referring Provider: Lamonte Sakai Treating Provider/Extender: Skipper Cliche in Treatment: 0 Information Obtained from: Patient Chief Complaint Right LE Ulcer Electronic Signature(s) Signed: 06/02/2021 9:56:21 AM By: Worthy Keeler PA-C Entered By: Worthy Keeler on 06/02/2021 09:56:21 [name] ([id number]) -------------------------------------------------------------------------------- Debridement Details Patient Name: Mario Baxter, Mario Baxter. Date of Service: 06/02/2021 8:45 AM Medical Record Number: [id number] Patient Account Number: [id number] Date of Birth/Sex: Jul 26, 1930 (88 y.o. M) Treating RN: Cornell Barman Primary Care Provider: Dorisann Frames Other Clinician: Referring Provider: Lamonte Sakai Treating Provider/Extender: Skipper Cliche in Treatment: 0 Debridement Performed for Wound #1 Right,Midline,Anterior Lower Leg Assessment: Performed By: Physician Tommie Sams., PA-C Debridement Type: Debridement Severity of Tissue Pre Debridement: Fat layer exposed Level of Consciousness (Pre- Awake and Alert procedure): Pre-procedure Verification/Time Out Yes - 10:02 Taken: Total Area Debrided (L x W): 3.6 (cm) x 1.7 (cm) = 6.12 (cm) Tissue and other material Viable, Non-Viable, Slough, Subcutaneous, Fibrin/Exudate, Slough debrided: Level: Skin/Subcutaneous Tissue Debridement Description: Excisional Instrument: Curette Bleeding: Minimum Hemostasis Achieved: Pressure Response to Treatment: Procedure was tolerated well Level of Consciousness (Post- Awake and Alert procedure): Post Debridement Measurements of Total Wound Length:  (cm) 3.6 Width: (cm) 1.7 Depth: (cm) 0.2 Volume: (cm) 0.961 Character of Wound/Ulcer Post Debridement: Stable Severity of Tissue Post Debridement: Fat layer exposed Post Procedure Diagnosis Same as Pre-procedure Electronic Signature(s) Signed: 06/02/2021 4:48:07 PM By: Worthy Keeler PA-C Signed: 06/06/2021 9:26:54 AM By: Gretta Cool, BSN, RN, CWS, Kim RN, BSN Entered By: Gretta Cool, BSN, RN, CWS, Kim on 06/02/2021 10:03:36 [name] ([id number]) -------------------------------------------------------------------------------- HPI Details Patient Name: Mario Baxter, Mario Baxter. Date of Service: 06/02/2021 8:45 AM Medical Record Number: [id number] Patient Account Number: [id number] Date of Birth/Sex: April 29, 1931 (88 y.o. M) Treating RN: Cornell Barman Primary Care Provider: Dorisann Frames Other Clinician: Referring Provider: Lamonte Sakai Treating Provider/Extender: Skipper Cliche in Treatment: 0 History of Present Illness HPI Description: 06/02/2021 upon evaluation patient presents for initial inspection here in the clinic concerning issues that has been having with a wound on his right lower extremity. This is something that has been going on since around January 23. He is also been on antibiotics since the 30th that is doxycycline. With that being said no wound culture was taken prior to the initiation of antibiotic therapy. Currently the wound is still extremely red around the edges and very tender to touch as well. Fortunately I do not see any signs of active infection at this time. They have been soaking it with Epson salt for some time and using Neosporin now. Prior to that they were cleaning it with peroxide but it was not getting better so they switched away from that. 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 The patient does have a history of chronic venous insufficiency, hypertension, but otherwise has really no major medical problems which is  good news. Electronic Signature(s) Signed: 06/02/2021 12:00:01 PM By: Worthy Keeler PA-C Entered By: Worthy Keeler on 06/02/2021 12:00:01 [name] ([id number]) -------------------------------------------------------------------------------- Physical Exam Details Patient Name: Mario Baxter, Mario Baxter. Date of Service: 06/02/2021 8:45 AM Medical Record Number: [id number] Patient Account Number: [id number] Date of Birth/Sex: Oct 30, 1930 (88 y.o. M) Treating RN:  Cornell Barman Primary Care Provider: Dorisann Frames Other Clinician: Referring Provider: Lamonte Sakai Treating Provider/Extender: Skipper Cliche in Treatment: 0 Constitutional sitting or standing blood pressure is within target range for patient.. pulse regular and within target range for patient.Marland Kitchen respirations regular, non- labored and within target range for patient.Marland Kitchen temperature within target range for patient.. Well-nourished and well-hydrated in no acute distress. Eyes conjunctiva clear no eyelid edema noted. pupils equal round and reactive to light and accommodation. Ears, Nose, Mouth, and Throat no gross abnormality of ear auricles or external auditory canals. normal hearing noted during conversation. mucus membranes moist. Respiratory normal breathing without difficulty. Cardiovascular 2+ dorsalis pedis/posterior tibialis pulses. no clubbing, cyanosis, significant edema, <3 sec cap refill. Musculoskeletal normal gait and posture. no significant deformity or arthritic changes, no loss or range of motion, no clubbing. Psychiatric this patient is able to make decisions and demonstrates good insight into disease process. Alert and Oriented x 3. pleasant and cooperative. Notes Upon evaluation today patient appears to be doing a little poorly in regard to the irritation around the edges of the wound. Fortunately there does not appear to be any signs of active infection locally nor systemically at this time which is great news he  is somewhat tender once we sprayed with benzocaine that actually got much better. He has excellent pulses and good capillary refill but he did have ABIs that were noncompressible. Nonetheless I do not see any limitations in arterial flow here I think he could support compression therapy without complication. Electronic Signature(s) Signed: 06/02/2021 12:01:56 PM By: Worthy Keeler PA-C Entered By: Worthy Keeler on 06/02/2021 12:01:56 [name] ([id number]) -------------------------------------------------------------------------------- Physician Orders Details Patient Name: Mario Baxter, Mario Baxter. Date of Service: 06/02/2021 8:45 AM Medical Record Number: [id number] Patient Account Number: [id number] Date of Birth/Sex: [birth year] (88 y.o. M) Treating RN: Cornell Barman Primary Care Provider: Dorisann Frames Other Clinician: Referring Provider: Lamonte Sakai Treating Provider/Extender: Skipper Cliche in Treatment: 0 Verbal / Phone Orders: No Diagnosis Coding ICD-10 Coding Code Description I87.2 Venous insufficiency (chronic) (peripheral) L97.812 Non-pressure chronic ulcer of other part of right lower leg with fat layer exposed [place] (primary) hypertension Follow-up Appointments o Return Appointment in 1 week. o Nurse Visit as needed Bathing/ Shower/ Hygiene o May shower; gently cleanse wound with antibacterial soap, rinse and pat dry prior to dressing wounds Medications-Please add to medication list. o P.O. Antibiotics - continue antibiotics Wound Treatment Wound #1 - Lower Leg Wound Laterality: Right, Midline, Anterior Primary Dressing: Promogran Matrix 4.34 (in) (Generic) Discharge Instructions: Moisten w/normal saline or sterile water; Cover wound as directed. Do not remove from wound bed. Secondary Dressing: Gauze Discharge Instructions: As directed: dry, moistened with saline or moistened with Dakins Solution Compression Wrap: Profore Lite LF 3 Multilayer  Compression Bandaging System Discharge Instructions: Apply 3 multi-layer wrap as prescribed. Laboratory o Bacteria identified in Wound by Culture (MICRO) - Right lower leg oooo LOINC Code: 2831-5 oooo Convenience Name: Wound culture routine Electronic Signature(s) Signed: 06/02/2021 4:48:07 PM By: Worthy Keeler PA-C Signed: 06/06/2021 9:26:54 AM By: Gretta Cool, BSN, RN, CWS, Kim RN, BSN Entered By: Gretta Cool, BSN, RN, CWS, Kim on 06/02/2021 10:11:03 [name] ([id number]) -------------------------------------------------------------------------------- Problem List Details Patient Name: DONIEL, MAIELLO. Date of Service: 06/02/2021 8:45 AM Medical Record Number: [id number] Patient Account Number: [id number] Date of Birth/Sex: 21-Aug-1930 (88 y.o. M) Treating RN: Cornell Barman Primary Care Provider: Dorisann Frames Other Clinician: Referring Provider: Lamonte Sakai Treating Provider/Extender: Skipper Cliche in Treatment:  0 Active Problems ICD-10 Encounter Code Description Active Date MDM Diagnosis I87.2 Venous insufficiency (chronic) (peripheral) 06/02/2021 No Yes L97.812 Non-pressure chronic ulcer of other part of right lower leg with fat layer 06/02/2021 No Yes exposed [place] (primary) hypertension 06/02/2021 No Yes Inactive Problems Resolved Problems Electronic Signature(s) Signed: 06/02/2021 9:56:05 AM By: Worthy Keeler PA-C Previous Signature: 06/02/2021 9:30:17 AM Version By: Worthy Keeler PA-C Entered By: Worthy Keeler on 06/02/2021 09:56:04 [name] ([id number]) -------------------------------------------------------------------------------- Progress Note Details Patient Name: Mario Baxter, Mario C. Date of Service: 06/02/2021 8:45 AM Medical Record Number: [id number] Patient Account Number: [id number] Date of Birth/Sex: [birth year] (88 y.o. M) Treating RN: Cornell Barman Primary Care Provider: Dorisann Frames Other Clinician: Referring Provider: Lamonte Sakai Treating  Provider/Extender: Skipper Cliche in Treatment: 0 Subjective Chief Complaint Information obtained from Patient Right LE Ulcer History of Present Illness (HPI) 06/02/2021 upon evaluation patient presents for initial inspection here in the clinic concerning issues that has been having with a wound on his right lower extremity. This is something that has been going on since around January 23. He is also been on antibiotics since the 30th that is doxycycline. With that being said no wound culture was taken prior to the initiation of antibiotic therapy. Currently the wound is still extremely red around the edges and very tender to touch as well. Fortunately I do not see any signs of active infection at this time. They have been soaking it with Epson salt for some time and using Neosporin now. Prior to that they were cleaning it with peroxide but it was not getting better so they switched away from that. 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 The patient does have a history of chronic venous insufficiency, hypertension, but otherwise has really no major medical problems which is good news. Patient History Information obtained from Patient, Chart. Allergies No Known Drug Allergies Social History Never smoker, Marital Status - Married, Alcohol Use - Daily, Drug Use - No History, Caffeine Use - Daily. Medical History Eyes Patient has history of Cataracts - removed, Glaucoma Endocrine Denies history of Type II Diabetes Medical And Surgical History Notes Constitutional Symptoms (General Health) Right hip placement. Endocrine Borderline diet controlled for 10 years. Review of Systems (ROS) Ear/Nose/Mouth/Throat Denies complaints or symptoms of Difficult clearing ears, Sinusitis. Hematologic/Lymphatic Denies complaints or symptoms of Bleeding / Clotting Disorders, Human Immunodeficiency Virus. Respiratory Denies complaints or symptoms of  Chronic or frequent coughs, Shortness of Breath. Cardiovascular Complains or has symptoms of LE edema. Gastrointestinal Denies complaints or symptoms of Frequent diarrhea, Nausea, Vomiting. Endocrine Denies complaints or symptoms of Hepatitis, Thyroid disease, Polydypsia (Excessive Thirst). Genitourinary Denies complaints or symptoms of Kidney failure/ Dialysis, Incontinence/dribbling. Immunological Denies complaints or symptoms of Hives, Itching. Integumentary (Skin) Complains or has symptoms of Wounds, Bleeding or bruising tendency. Musculoskeletal Denies complaints or symptoms of Muscle Pain, Muscle Weakness. Neurologic Denies complaints or symptoms of Numbness/parasthesias, Focal/Weakness. Oncologic Skin Cancers removed from left ear Mario Baxter, Mario Baxter ([id number]) Objective Constitutional sitting or standing blood pressure is within target range for patient.. pulse regular and within target range for patient.Marland Kitchen respirations regular, non- labored and within target range for patient.Marland Kitchen temperature within target range for patient.. Well-nourished and well-hydrated in no acute distress. Vitals Time Taken: 9:02 AM, Height: 72 in, Weight: 215 lbs, BMI: 29.2, Temperature: 97.9 F, Pulse: 73 bpm, Respiratory Rate: 16 breaths/min, Blood Pressure: 128/62 mmHg. Eyes conjunctiva clear no eyelid edema noted. pupils  equal round and reactive to light and accommodation. Ears, Nose, Mouth, and Throat no gross abnormality of ear auricles or external auditory canals. normal hearing noted during conversation. mucus membranes moist. Respiratory normal breathing without difficulty. Cardiovascular 2+ dorsalis pedis/posterior tibialis pulses. no clubbing, cyanosis, significant edema, Musculoskeletal normal gait and posture. no significant deformity or arthritic changes, no loss or range of motion, no clubbing. Psychiatric this patient is able to make decisions and demonstrates good insight into disease  process. Alert and Oriented x 3. pleasant and cooperative. General Notes: Upon evaluation today patient appears to be doing a little poorly in regard to the irritation around the edges of the wound. Fortunately there does not appear to be any signs of active infection locally nor systemically at this time which is great news he is somewhat tender once we sprayed with benzocaine that actually got much better. He has excellent pulses and good capillary refill but he did have ABIs that were noncompressible. Nonetheless I do not see any limitations in arterial flow here I think he could support compression therapy without complication. Integumentary (Hair, Skin) Wound #1 status is Open. Original cause of wound was Trauma. The date acquired was: 05/22/2021. The wound is located on the Right,Midline,Anterior Lower Leg. The wound measures 3.6cm length x 1.7cm width x 0.1cm depth; 4.807cm^2 area and 0.481cm^3 volume. There is no tunneling or undermining noted. There is a medium amount of serous drainage noted. The wound margin is flat and intact. There is small (1-33%) red granulation within the wound bed. There is a large (67-100%) amount of necrotic tissue within the wound bed including Eschar and Adherent Slough. Assessment Active Problems ICD-10 Venous insufficiency (chronic) (peripheral) Non-pressure chronic ulcer of other part of right lower leg with fat layer exposed [place] (primary) hypertension Procedures Wound #1 Pre-procedure diagnosis of Wound #1 is a Venous Leg Ulcer located on the Right,Midline,Anterior Lower Leg .Severity of Tissue Pre Debridement is: Fat layer exposed. There was a Excisional Skin/Subcutaneous Tissue Debridement with a total area of 6.12 sq cm performed by Tommie Sams., PA-C. With the following instrument(s): Curette to remove Viable and Non-Viable tissue/material. Material removed includes Subcutaneous Tissue, Slough, and Fibrin/Exudate. No specimens were taken. A  time out was conducted at 10:02, prior to the start of the procedure. A Minimum amount of bleeding was controlled with Pressure. The procedure was tolerated well. Post Debridement Measurements: 3.6cm length x 1.7cm width x 0.2cm depth; 0.961cm^3 volume. Character of Wound/Ulcer Post Debridement is stable. Severity of Tissue Post Debridement is: Fat layer exposed. Mario Baxter, Mario Baxter ([id number]) Post procedure Diagnosis Wound #1: Same as Pre-Procedure Plan Follow-up Appointments: Return Appointment in 1 week. Nurse Visit as needed Bathing/ Shower/ Hygiene: May shower; gently cleanse wound with antibacterial soap, rinse and pat dry prior to dressing wounds Medications-Please add to medication list.: P.O. Antibiotics - continue antibiotics Laboratory ordered were: Wound culture routine - Right lower leg WOUND #1: - Lower Leg Wound Laterality: Right, Midline, Anterior Primary Dressing: Promogran Matrix 4.34 (in) (Generic) Discharge Instructions: Moisten w/normal saline or sterile water; Cover wound as directed. Do not remove from wound bed. Secondary Dressing: Gauze Discharge Instructions: As directed: dry, moistened with saline or moistened with Dakins Solution Compression Wrap: Profore Lite LF 3 Multilayer Compression Bandaging System Discharge Instructions: Apply 3 multi-layer wrap as prescribed. 1. Would recommend currently that we go ahead and initiate treatment with a continuation of the doxycycline for now although I did obtain a wound culture and based on the results of  the culture will make any changes in the therapy as needed following. 2. I am also can recommend that we go ahead and initiate treatment with a silver collagen dressing to try to help grow new skin I think this would be the best way to go. 3. I would also suggest a 3 layer compression wrap and I think this is nothing too strong and hopefully it will help keep the edema under control. We will see patient back for  reevaluation in 1 week here in the clinic. If anything worsens or changes patient will contact our office for additional recommendations. Electronic Signature(s) Signed: 06/02/2021 12:07:20 PM By: Worthy Keeler PA-C Entered By: Worthy Keeler on 06/02/2021 12:07:20 [name] ([id number]) -------------------------------------------------------------------------------- ROS/PFSH Details Patient Name: Mario Baxter, Mario Baxter. Date of Service: 06/02/2021 8:45 AM Medical Record Number: [id number] Patient Account Number: [id number] Date of Birth/Sex: January 02, 1931 (88 y.o. M) Treating RN: Cornell Barman Primary Care Provider: Dorisann Frames Other Clinician: Referring Provider: Lamonte Sakai Treating Provider/Extender: Skipper Cliche in Treatment: 0 Information Obtained From Patient Chart Ear/Nose/Mouth/Throat Complaints and Symptoms: Negative for: Difficult clearing ears; Sinusitis Hematologic/Lymphatic Complaints and Symptoms: Negative for: Bleeding / Clotting Disorders; Human Immunodeficiency Virus Respiratory Complaints and Symptoms: Negative for: Chronic or frequent coughs; Shortness of Breath Cardiovascular Complaints and Symptoms: Positive for: LE edema Gastrointestinal Complaints and Symptoms: Negative for: Frequent diarrhea; Nausea; Vomiting Endocrine Complaints and Symptoms: Negative for: Hepatitis; Thyroid disease; Polydypsia (Excessive Thirst) Medical History: Negative for: Type II Diabetes Past Medical History Notes: Borderline diet controlled for 10 years. Genitourinary Complaints and Symptoms: Negative for: Kidney failure/ Dialysis; Incontinence/dribbling Immunological Complaints and Symptoms: Negative for: Hives; Itching Integumentary (Skin) Complaints and Symptoms: Positive for: Wounds; Bleeding or bruising tendency Musculoskeletal Complaints and Symptoms: Negative for: Muscle Pain; Muscle Weakness Mario Baxter, Mario Baxter. ([id number]) Neurologic Complaints and  Symptoms: Negative for: Numbness/parasthesias; Focal/Weakness Constitutional Symptoms (General Health) Medical History: Past Medical History Notes: Right hip placement. Eyes Medical History: Positive for: Cataracts - removed; Glaucoma Oncologic Complaints and Symptoms: Review of System Notes: Skin Cancers removed from left ear HBO Extended History Items Eyes: Eyes: Cataracts Glaucoma Immunizations Pneumococcal Vaccine: Received Pneumococcal Vaccination: Yes Received Pneumococcal Vaccination On or After 60th Birthday: Yes Implantable Devices No devices added Family and Social History Never smoker; Marital Status - Married; Alcohol Use: Daily; Drug Use: No History; Caffeine Use: Daily Electronic Signature(s) Signed: 06/02/2021 4:48:07 PM By: Worthy Keeler PA-C Signed: 06/06/2021 9:26:54 AM By: Gretta Cool, BSN, RN, CWS, Kim RN, BSN Entered By: Gretta Cool, BSN, RN, CWS, Kim on 06/02/2021 09:09:54 [name] ([id number]) -------------------------------------------------------------------------------- [place] Details Patient Name: Mario Baxter, Mario Baxter. Date of Service: 06/02/2021 Medical Record Number: [id number] Patient Account Number: [id number] Date of Birth/Sex: 1930-06-17 (88 y.o. M) Treating RN: Cornell Barman Primary Care Provider: Dorisann Frames Other Clinician: Referring Provider: Lamonte Sakai Treating Provider/Extender: Skipper Cliche in Treatment: 0 Diagnosis Coding ICD-10 Codes Code Description I87.2 Venous insufficiency (chronic) (peripheral) L97.812 Non-pressure chronic ulcer of other part of right lower leg with fat layer exposed [place] (primary) hypertension Facility Procedures CPT4 Code: 36144315 Description: 99213 - WOUND CARE VISIT-LEV 3 EST PT Modifier: Quantity: 1 CPT4 Code: [id number] Description: 11042 - DEB SUBQ TISSUE 20 SQ CM/< Modifier: Quantity: 1 CPT4 Code: Description: ICD-10 Diagnosis Description P50.932 Non-pressure chronic ulcer of other part  of right lower leg with fat laye Modifier: r exposed Quantity: Physician Procedures CPT4 Code: 6712458 Description: 09983 - WC PHYS LEVEL 4 - EST PT Modifier: 25 Quantity: 1 CPT4 Code: Description:  ICD-10 Diagnosis Description I87.2 Venous insufficiency (chronic) (peripheral) L97.812 Non-pressure chronic ulcer of other part of right lower leg with fat lay I10 Essential (primary) hypertension Modifier: er exposed Quantity: CPT4 Code: [id number] Description: 11042 - WC PHYS SUBQ TISS 20 SQ CM Modifier: Quantity: 1 CPT4 Code: Description: ICD-10 Diagnosis Description X67.289 Non-pressure chronic ulcer of other part of right lower leg with fat lay Modifier: er exposed Quantity: Electronic Signature(s) Signed: 06/02/2021 12:07:35 PM By: Worthy Keeler PA-C Entered By: Worthy Keeler on 06/02/2021 12:07:34

## 2021-06-08 LAB — AEROBIC/ANAEROBIC CULTURE W GRAM STAIN (SURGICAL/DEEP WOUND)
Culture: NO GROWTH
Gram Stain: NONE SEEN

## 2021-06-09 ENCOUNTER — Encounter: Payer: Medicare Other | Admitting: Physician Assistant

## 2021-06-09 ENCOUNTER — Other Ambulatory Visit: Payer: Self-pay

## 2021-06-09 DIAGNOSIS — L97812 Non-pressure chronic ulcer of other part of right lower leg with fat layer exposed: Secondary | ICD-10-CM | POA: Diagnosis not present

## 2021-06-09 NOTE — Progress Notes (Addendum)
LEHI, PHIFER ([id number]) Visit Report for 06/09/2021 Chief Complaint Document Details Patient Name: Mario Baxter, Mario Baxter. Date of Service: 06/09/2021 8:45 AM Medical Record Number: [id number] Patient Account Number: [id number] Date of Birth/Sex: Aug 30, 1930 (88 y.o. M) Treating RN: Carlene Coria Primary Care Provider: Dorisann Frames Other Clinician: Referring Provider: Dorisann Frames Treating Provider/Extender: Skipper Cliche in Treatment: 1 Information Obtained from: Patient Chief Complaint Right LE Ulcer Electronic Signature(s) Signed: 06/09/2021 9:03:33 AM By: Worthy Keeler PA-C Entered By: Worthy Keeler on 06/09/2021 09:03:32 [name] ([id number]) -------------------------------------------------------------------------------- HPI Details Patient Name: Mario Baxter. Date of Service: 06/09/2021 8:45 AM Medical Record Number: [id number] Patient Account Number: [id number] Date of Birth/Sex: 11/13/1930 (88 y.o. M) Treating RN: Carlene Coria Primary Care Provider: Dorisann Frames Other Clinician: Referring Provider: Dorisann Frames Treating Provider/Extender: Skipper Cliche in Treatment: 1 History of Present Illness HPI Description: 06/02/2021 upon evaluation patient presents for initial inspection here in the clinic concerning issues that has been having with a wound on his right lower extremity. This is something that has been going on since around January 23. He is also been on antibiotics since the 30th that is doxycycline. With that being said no wound culture was taken prior to the initiation of antibiotic therapy. Currently the wound is still extremely red around the edges and very tender to touch as well. Fortunately I do not see any signs of active infection at this time. They have been soaking it with Epson salt for some time and using Neosporin now. Prior to that they were cleaning it with peroxide but it was not getting better so they switched away from that.  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 The patient does have a history of chronic venous insufficiency, hypertension, but otherwise has really no major medical problems which is good news. 06/09/2021 upon evaluation today patient appears to be doing well with regard to his wound. He has been tolerating the dressing changes without complication. Fortunately I do not see any signs of active infection locally or systemically at this point. Electronic Signature(s) Signed: 06/09/2021 9:25:29 AM By: Worthy Keeler PA-C Entered By: Worthy Keeler on 06/09/2021 09:25:29 PHI, AVANS ([id number]) -------------------------------------------------------------------------------- Physical Exam Details Patient Name: Mario Baxter. Date of Service: 06/09/2021 8:45 AM Medical Record Number: [id number] Patient Account Number: [id number] Date of Birth/Sex: [birth year] (88 y.o. M) Treating RN: Carlene Coria Primary Care Provider: Dorisann Frames Other Clinician: Referring Provider: Dorisann Frames Treating Provider/Extender: Skipper Cliche in Treatment: 1 Constitutional Well-nourished and well-hydrated in no acute distress. Respiratory normal breathing without difficulty. Psychiatric this patient is able to make decisions and demonstrates good insight into disease process. Alert and Oriented x 3. pleasant and cooperative. Notes Upon inspection patient's wound bed showed signs of good granulation epithelization at this point. Fortunately I do not see any evidence of infection at this time and I think that his wound is measuring smaller and looking much better. I would recommend that we going continue with the wound care measures as before. Electronic Signature(s) Signed: 06/09/2021 9:25:50 AM By: Worthy Keeler PA-C Entered By: Worthy Keeler on 06/09/2021 09:25:49 [name]  ([id number]) -------------------------------------------------------------------------------- Physician Orders Details Patient Name: Mario Baxter. Date of Service: 06/09/2021 8:45 AM Medical Record Number: [id number] Patient Account Number: [id number] Date of Birth/Sex: 01/02/1931 (88 y.o. M) Treating RN: Carlene Coria Primary Care Provider: Dorisann Frames Other Clinician: Referring Provider: Dorisann Frames Treating Provider/Extender: Joaquim Lai,  Vance Gather in Treatment: 1 Verbal / Phone Orders: No Diagnosis Coding ICD-10 Coding Code Description I87.2 Venous insufficiency (chronic) (peripheral) L97.812 Non-pressure chronic ulcer of other part of right lower leg with fat layer exposed [place] (primary) hypertension Follow-up Appointments o Return Appointment in 1 week. o Nurse Visit as needed Bathing/ Shower/ Hygiene o May shower; gently cleanse wound with antibacterial soap, rinse and pat dry prior to dressing wounds Medications-Please add to medication list. o P.O. Antibiotics - continue antibiotics Wound Treatment Wound #1 - Lower Leg Wound Laterality: Right, Midline, Anterior Primary Dressing: Promogran Matrix 4.34 (in) (Generic) Discharge Instructions: Moisten w/normal saline or sterile water; Cover wound as directed. Do not remove from wound bed. Secondary Dressing: Gauze Discharge Instructions: As directed: dry, moistened with saline or moistened with Dakins Solution Compression Wrap: Profore Lite LF 3 Multilayer Compression Bandaging System Discharge Instructions: Apply 3 multi-layer wrap as prescribed. Electronic Signature(s) Signed: 06/09/2021 9:21:59 AM By: Carlene Coria RN Signed: 06/09/2021 4:49:47 PM By: Worthy Keeler PA-C Entered By: Carlene Coria on 06/09/2021 09:06:52 [name] ([id number]) -------------------------------------------------------------------------------- Problem List Details Patient Name: Mario Baxter. Date of Service:  06/09/2021 8:45 AM Medical Record Number: [id number] Patient Account Number: [id number] Date of Birth/Sex: [birth year] (88 y.o. M) Treating RN: Carlene Coria Primary Care Provider: Dorisann Frames Other Clinician: Referring Provider: Dorisann Frames Treating Provider/Extender: Skipper Cliche in Treatment: 1 Active Problems ICD-10 Encounter Code Description Active Date MDM Diagnosis I87.2 Venous insufficiency (chronic) (peripheral) 06/02/2021 No Yes L97.812 Non-pressure chronic ulcer of other part of right lower leg with fat layer 06/02/2021 No Yes exposed [place] (primary) hypertension 06/02/2021 No Yes Inactive Problems Resolved Problems Electronic Signature(s) Signed: 06/09/2021 9:02:16 AM By: Worthy Keeler PA-C Entered By: Worthy Keeler on 06/09/2021 09:02:15 [name] ([id number]) -------------------------------------------------------------------------------- Progress Note Details Patient Name: KAYDENCE, BABA. Date of Service: 06/09/2021 8:45 AM Medical Record Number: [id number] Patient Account Number: [id number] Date of Birth/Sex: [birth year] (88 y.o. M) Treating RN: Carlene Coria Primary Care Provider: Dorisann Frames Other Clinician: Referring Provider: Dorisann Frames Treating Provider/Extender: Skipper Cliche in Treatment: 1 Subjective Chief Complaint Information obtained from Patient Right LE Ulcer History of Present Illness (HPI) 06/02/2021 upon evaluation patient presents for initial inspection here in the clinic concerning issues that has been having with a wound on his right lower extremity. This is something that has been going on since around January 23. He is also been on antibiotics since the 30th that is doxycycline. With that being said no wound culture was taken prior to the initiation of antibiotic therapy. Currently the wound is still extremely red around the edges and very tender to touch as well. Fortunately I do not see any signs of active  infection at this time. They have been soaking it with Epson salt for some time and using Neosporin now. Prior to that they were cleaning it with peroxide but it was not getting better so they switched away from that. 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 The patient does have a history of chronic venous insufficiency, hypertension, but otherwise has really no major medical problems which is good news. 06/09/2021 upon evaluation today patient appears to be doing well with regard to his wound. He has been tolerating the dressing changes without complication. Fortunately I do not see any signs of active infection locally or systemically at this point. Objective Constitutional Well-nourished and well-hydrated in no acute distress. Vitals Time Taken:  8:53 AM, Height: 72 in, Weight: 215 lbs, BMI: 29.2, Temperature: 98.5 F, Pulse: 67 bpm, Respiratory Rate: 18 breaths/min, Blood Pressure: 130/80 mmHg. Respiratory normal breathing without difficulty. Psychiatric this patient is able to make decisions and demonstrates good insight into disease process. Alert and Oriented x 3. pleasant and cooperative. General Notes: Upon inspection patient's wound bed showed signs of good granulation epithelization at this point. Fortunately I do not see any evidence of infection at this time and I think that his wound is measuring smaller and looking much better. I would recommend that we going continue with the wound care measures as before. Integumentary (Hair, Skin) Wound #1 status is Open. Original cause of wound was Trauma. The date acquired was: 05/22/2021. The wound has been in treatment 1 weeks. The wound is located on the Right,Midline,Anterior Lower Leg. The wound measures 3.6cm length x 1.5cm width x 0.1cm depth; 4.241cm^2 area and 0.424cm^3 volume. There is Fat Layer (Subcutaneous Tissue) exposed. There is no tunneling or undermining noted. There is a  medium amount of serous drainage noted. The wound margin is flat and intact. There is large (67-100%) red granulation within the wound bed. There is a small (1-33%) amount of necrotic tissue within the wound bed. Assessment Active Problems ICD-10 Venous insufficiency (chronic) (peripheral) PIERO, MUSTARD. ([id number]) Non-pressure chronic ulcer of other part of right lower leg with fat layer exposed [place] (primary) hypertension Procedures Wound #1 Pre-procedure diagnosis of Wound #1 is a Venous Leg Ulcer located on the Right,Midline,Anterior Lower Leg . There was a Three Layer Compression Therapy Procedure by Carlene Coria, RN. Post procedure Diagnosis Wound #1: Same as Pre-Procedure Plan Follow-up Appointments: Return Appointment in 1 week. Nurse Visit as needed Bathing/ Shower/ Hygiene: May shower; gently cleanse wound with antibacterial soap, rinse and pat dry prior to dressing wounds Medications-Please add to medication list.: P.O. Antibiotics - continue antibiotics WOUND #1: - Lower Leg Wound Laterality: Right, Midline, Anterior Primary Dressing: Promogran Matrix 4.34 (in) (Generic) Discharge Instructions: Moisten w/normal saline or sterile water; Cover wound as directed. Do not remove from wound bed. Secondary Dressing: Gauze Discharge Instructions: As directed: dry, moistened with saline or moistened with Dakins Solution Compression Wrap: Profore Lite LF 3 Multilayer Compression Bandaging System Discharge Instructions: Apply 3 multi-layer wrap as prescribed. 1. I will suggest that we continue with the silver collagen dressing which I think is still probably the best option for him. 2. I am also can recommend that we have the patient continue with the ABD pad to cover followed by a 3 layer compression wrap. 3. I would also suggest the patient continue to elevate his legs much as possible obviously when to keep this under good control. We will see patient back for  reevaluation in 1 week here in the clinic. If anything worsens or changes patient will contact our office for additional recommendations. Electronic Signature(s) Signed: 06/09/2021 9:26:24 AM By: Worthy Keeler PA-C Entered By: Worthy Keeler on 06/09/2021 63:01:60 RICKARD, KENNERLY ([id number]) -------------------------------------------------------------------------------- SuperBill Details Patient Name: JERMIE, HIPPE. Date of Service: 06/09/2021 Medical Record Number: [id number] Patient Account Number: [id number] Date of Birth/Sex: 1930-08-14 (88 y.o. M) Treating RN: Carlene Coria Primary Care Provider: Dorisann Frames Other Clinician: Referring Provider: Dorisann Frames Treating Provider/Extender: Skipper Cliche in Treatment: 1 Diagnosis Coding ICD-10 Codes Code Description I87.2 Venous insufficiency (chronic) (peripheral) L97.812 Non-pressure chronic ulcer of other part of right lower leg with fat layer exposed [place] (primary) hypertension Facility Procedures CPT4 Code: [id number] Description: (  Facility Use Only) [phone] - [facility] RT LEG Modifier: Quantity: 1 Physician Procedures CPT4 Code: [id number] Description: 75830 - WC PHYS LEVEL 3 - EST PT Modifier: Quantity: 1 CPT4 Code: Description: ICD-10 Diagnosis Description I87.2 Venous insufficiency (chronic) (peripheral) L97.812 Non-pressure chronic ulcer of other part of right lower leg with fat la I10 Ess[place] (primary) hypertension Modifier: yer exposed Quantity: Electronic Signature(s) Signed: 06/09/2021 9:28:32 AM By: Worthy Keeler PA-C Previous Signature: 06/09/2021 9:21:59 AM Version By: Carlene Coria RN Entered By: Worthy Keeler on 06/09/2021 74:60:02

## 2021-06-09 NOTE — Progress Notes (Signed)
SIRIS, HOOS ([id number]) Visit Report for 06/09/2021 Arrival Information Details Patient Name: Mario Baxter, Mario Baxter. Date of Service: 06/09/2021 8:45 AM Medical Record Number: [id number] Patient Account Number: [id number] Date of Birth/Sex: Aug 01, 1930 (88 y.o. M) Treating RN: Carlene Coria Primary Care Garey Alleva: Dorisann Frames Other Clinician: Referring Braelin Costlow: Dorisann Frames Treating Khalif Stender/Extender: Skipper Cliche in Treatment: 1 Visit Information History Since Last Visit All ordered tests and consults were completed: No Patient Arrived: Ambulatory Added or deleted any medications: No Arrival Time: 08:49 Any new allergies or adverse reactions: No Accompanied By: self Had a fall or experienced change in No Transfer Assistance: None activities of daily living that may affect Patient Identification Verified: Yes risk of falls: Secondary Verification Process Completed: Yes Signs or symptoms of abuse/neglect since last visito No Patient Requires Transmission-Based No Hospitalized since last visit: No Precautions: Implantable device outside of the clinic excluding No Patient Has Alerts: Yes cellular tissue based products placed in the center Patient Alerts: Borderline DM diet since last visit: cont. Has Dressing in Place as Prescribed: Yes Pain Present Now: No Electronic Signature(s) Signed: 06/09/2021 9:21:59 AM By: Carlene Coria RN Entered By: Carlene Coria on 06/09/2021 08:53:47 Mario Baxter ([id number]) -------------------------------------------------------------------------------- Clinic Level of Care Assessment Details Patient Name: Mario Baxter, Mario Baxter. Date of Service: 06/09/2021 8:45 AM Medical Record Number: [id number] Patient Account Number: [id number] Date of Birth/Sex: 12/18/1930 (88 y.o. M) Treating RN: Carlene Coria Primary Care Lezlee Gills: Dorisann Frames Other Clinician: Referring Saydie Gerdts: Dorisann Frames Treating Jabar Krysiak/Extender: Skipper Cliche in  Treatment: 1 Clinic Level of Care Assessment Items TOOL 1 Quantity Score []  - Use when EandM and Procedure is performed on INITIAL visit 0 ASSESSMENTS - Nursing Assessment / Reassessment []  - General Physical Exam (combine w/ comprehensive assessment (listed just below) when performed on new 0 pt. evals) []  - 0 Comprehensive Assessment (HX, ROS, Risk Assessments, Wounds Hx, etc.) ASSESSMENTS - Wound and Skin Assessment / Reassessment []  - Dermatologic / Skin Assessment (not related to wound area) 0 ASSESSMENTS - Ostomy and/or Continence Assessment and Care []  - Incontinence Assessment and Management 0 []  - 0 Ostomy Care Assessment and Management (repouching, etc.) PROCESS - Coordination of Care []  - Simple Patient / Family Education for ongoing care 0 []  - 0 Complex (extensive) Patient / Family Education for ongoing care []  - 0 Staff obtains Programmer, systems, Records, Test Results / Process Orders []  - 0 Staff telephones HHA, Nursing Homes / Clarify orders / etc []  - 0 Routine Transfer to another Facility (non-emergent condition) []  - 0 Routine Hospital Admission (non-emergent condition) []  - 0 New Admissions / Biomedical engineer / Ordering NPWT, Apligraf, etc. []  - 0 Emergency Hospital Admission (emergent condition) PROCESS - Special Needs []  - Pediatric / Minor Patient Management 0 []  - 0 Isolation Patient Management []  - 0 Hearing / Language / Visual special needs []  - 0 Assessment of Community assistance (transportation, D/C planning, etc.) []  - 0 Additional assistance / Altered mentation []  - 0 Support Surface(s) Assessment (bed, cushion, seat, etc.) INTERVENTIONS - Miscellaneous []  - External ear exam 0 []  - 0 Patient Transfer (multiple staff / Civil Service fast streamer / Similar devices) []  - 0 Simple Staple / Suture removal (25 or less) []  - 0 Complex Staple / Suture removal (26 or more) []  - 0 Hypo/Hyperglycemic Management (do not check if billed separately) []  -  0 Ankle / Brachial Index (ABI) - do not check if billed separately Has the patient been seen at the hospital within the last three  years: Yes Total Score: 0 Level Of Care: ____ Mario Baxter ([id number]) Electronic Signature(s) Signed: 06/09/2021 9:21:59 AM By: Carlene Coria RN Entered By: Carlene Coria on 06/09/2021 09:07:37 Mario Baxter ([id number]) -------------------------------------------------------------------------------- Compression Therapy Details Patient Name: Mario Baxter, Mario Baxter. Date of Service: 06/09/2021 8:45 AM Medical Record Number: [id number] Patient Account Number: [id number] Date of Birth/Sex: 23-Jun-1930 (88 y.o. M) Treating RN: Carlene Coria Primary Care Florencio Hollibaugh: Dorisann Frames Other Clinician: Referring Demoni Gergen: Dorisann Frames Treating Valeri Sula/Extender: Skipper Cliche in Treatment: 1 Compression Therapy Performed for Wound Assessment: Wound #1 Right,Midline,Anterior Baxter Leg Performed By: Clinician Carlene Coria, RN Compression Type: Three Layer Post Procedure Diagnosis Same as Pre-procedure Electronic Signature(s) Signed: 06/09/2021 9:21:59 AM By: Carlene Coria RN Entered By: Carlene Coria on 06/09/2021 09:07:17 Mario Baxter ([id number]) -------------------------------------------------------------------------------- Encounter Discharge Information Details Patient Name: Mario Baxter, Mario Baxter. Date of Service: 06/09/2021 8:45 AM Medical Record Number: [id number] Patient Account Number: [id number] Date of Birth/Sex: 1930-12-21 (88 y.o. M) Treating RN: Carlene Coria Primary Care Adeleigh Barletta: Dorisann Frames Other Clinician: Referring Karlyn Glasco: Dorisann Frames Treating Adonai Helzer/Extender: Skipper Cliche in Treatment: 1 Encounter Discharge Information Items Discharge Condition: Stable Ambulatory Status: Ambulatory Discharge Destination: Home Transportation: Private Auto Accompanied By: self Schedule Follow-up Appointment: Yes Clinical Summary of Care:  Patient Declined Electronic Signature(s) Signed: 06/09/2021 9:21:59 AM By: Carlene Coria RN Entered By: Carlene Coria on 06/09/2021 09:09:16 Mario Baxter ([id number]) -------------------------------------------------------------------------------- Baxter Extremity Assessment Details Patient Name: Mario Baxter, Mario Baxter. Date of Service: 06/09/2021 8:45 AM Medical Record Number: [id number] Patient Account Number: [id number] Date of Birth/Sex: [birth year] (88 y.o. M) Treating RN: Carlene Coria Primary Care Emonii Wienke: Dorisann Frames Other Clinician: Referring Joandy Burget: Dorisann Frames Treating Kordelia Severin/Extender: Skipper Cliche in Treatment: 1 Edema Assessment Assessed: [Left: No] [Right: No] Edema: [Left: Ye] [Right: s] Calf Left: Right: Point of Measurement: 34 cm From Medial Instep 37 cm Ankle Left: Right: Point of Measurement: 13 cm From Medial Instep 24 cm Vascular Assessment Pulses: Dorsalis Pedis Palpable: [Right:Yes] Electronic Signature(s) Signed: 06/09/2021 9:21:59 AM By: Carlene Coria RN Entered By: Carlene Coria on 06/09/2021 09:00:44 Mario Baxter ([id number]) -------------------------------------------------------------------------------- Multi Wound Chart Details Patient Name: Mario Baxter. Date of Service: 06/09/2021 8:45 AM Medical Record Number: [id number] Patient Account Number: [id number] Date of Birth/Sex: [birth year] (88 y.o. M) Treating RN: Carlene Coria Primary Care Rannie Craney: Dorisann Frames Other Clinician: Referring Marlis Oldaker: Dorisann Frames Treating Cathline Dowen/Extender: Skipper Cliche in Treatment: 1 Vital Signs Height(in): 72 Pulse(bpm): 20 Weight(lbs): 215 Blood Pressure(mmHg): 130/80 Body Mass Index(BMI): 29.2 Temperature(F): 98.5 Respiratory Rate(breaths/min): 18 Photos: [N/A:N/A] Wound Location: Right, Midline, Anterior Baxter Leg N/A N/A Wounding Event: Trauma N/A N/A Primary Etiology: Venous Leg Ulcer N/A N/A Comorbid History: Cataracts,  Glaucoma N/A N/A Date Acquired: 05/22/2021 N/A N/A Weeks of Treatment: 1 N/A N/A Wound Status: Open N/A N/A Wound Recurrence: No N/A N/A Measurements L x W x D (cm) 3.6x1.5x0.1 N/A N/A Area (cm) : 4.241 N/A N/A Volume (cm) : 0.424 N/A N/A % Reduction in Area: 11.80% N/A N/A % Reduction in Volume: 11.90% N/A N/A Classification: Full Thickness Without Exposed N/A N/A Support Structures Exudate Amount: Medium N/A N/A Exudate Type: Serous N/A N/A Exudate Color: amber N/A N/A Wound Margin: Flat and Intact N/A N/A Granulation Amount: Large (67-100%) N/A N/A Granulation Quality: Red N/A N/A Necrotic Amount: Small (1-33%) N/A N/A Exposed Structures: Fat Layer (Subcutaneous Tissue): N/A N/A Yes Fascia: No Tendon: No Muscle: No Joint: No Bone: No Epithelialization: None N/A N/A Treatment Notes Electronic Signature(s) Signed: 06/09/2021 9:21:59 AM  By: Carlene Coria RN Entered By: Carlene Coria on 06/09/2021 Rulo Mario Baxter ([id number]) -------------------------------------------------------------------------------- [place] Details Patient Name: Mario Baxter, Mario Baxter. Date of Service: 06/09/2021 8:45 AM Medical Record Number: [id number] Patient Account Number: [id number] Date of Birth/Sex: 12-Sep-1930 (88 y.o. M) Treating RN: Carlene Coria Primary Care Noe Pittsley: Dorisann Frames Other Clinician: Referring Leily Capek: Dorisann Frames Treating Aspen Deterding/Extender: Skipper Cliche in Treatment: 1 Active Inactive Necrotic Tissue Nursing Diagnoses: Impaired tissue integrity related to necrotic/devitalized tissue Knowledge deficit related to management of necrotic/devitalized tissue Goals: Necrotic/devitalized tissue will be minimized in the wound bed Date Initiated: 06/02/2021 Target Resolution Date: 06/02/2021 Goal Status: Active Patient/caregiver will verbalize understanding of reason and process for debridement of necrotic tissue Date Initiated: 06/02/2021 Target  Resolution Date: 06/02/2021 Goal Status: Active Interventions: Assess patient pain level pre-, during and post procedure and prior to discharge Provide education on necrotic tissue and debridement process Treatment Activities: Excisional debridement : 06/02/2021 Notes: Orientation to the Wound Care Program Nursing Diagnoses: Knowledge deficit related to the wound healing center program Goals: Patient/caregiver will verbalize understanding of the [place] Date Initiated: 06/02/2021 Target Resolution Date: 06/02/2021 Goal Status: Active Interventions: Provide education on orientation to the wound center Notes: Venous Leg Ulcer Nursing Diagnoses: Actual venous Insuffiency (use after diagnosis is confirmed) Knowledge deficit related to disease process and management Potential for venous Insuffiency (use before diagnosis confirmed) Goals: Patient will maintain optimal edema control Date Initiated: 06/02/2021 Target Resolution Date: 06/02/2021 Goal Status: Active Patient/caregiver will verbalize understanding of disease process and disease management Date Initiated: 06/02/2021 Target Resolution Date: 06/02/2021 Goal Status: Active Verify adequate tissue perfusion prior to therapeutic compression application Date Initiated: 06/02/2021 Target Resolution Date: 06/02/2021 Mario Baxter, Mario Baxter ([id number]) Goal Status: Active Interventions: Assess peripheral edema status every visit. Compression as ordered Provide education on venous insufficiency Notes: Wound/Skin Impairment Nursing Diagnoses: Impaired tissue integrity Goals: Patient/caregiver will verbalize understanding of skin care regimen Date Initiated: 06/02/2021 Target Resolution Date: 06/02/2021 Goal Status: Active Ulcer/skin breakdown will have a volume reduction of 30% by week 4 Date Initiated: 06/02/2021 Target Resolution Date: 06/02/2021 Goal Status: Active Interventions: Assess patient/caregiver ability to obtain  necessary supplies Assess patient/caregiver ability to perform ulcer/skin care regimen upon admission and as needed Assess ulceration(s) every visit Treatment Activities: Referred to DME Camaron Cammack for dressing supplies : 06/02/2021 Skin care regimen initiated : 06/02/2021 Topical wound management initiated : 06/02/2021 Notes: Electronic Signature(s) Signed: 06/09/2021 9:21:59 AM By: Carlene Coria RN Entered By: Carlene Coria on 06/09/2021 09:05:58 Mario Baxter ([id number]) -------------------------------------------------------------------------------- Pain Assessment Details Patient Name: Mario Baxter, Mario Baxter. Date of Service: 06/09/2021 8:45 AM Medical Record Number: [id number] Patient Account Number: [id number] Date of Birth/Sex: [birth year] (88 y.o. M) Treating RN: Carlene Coria Primary Care Markcus Lazenby: Dorisann Frames Other Clinician: Referring Patria Warzecha: Dorisann Frames Treating Pier Bosher/Extender: Skipper Cliche in Treatment: 1 Active Problems Location of Pain Severity and Description of Pain Patient Has Paino No Site Locations Pain Management and Medication Current Pain Management: Electronic Signature(s) Signed: 06/09/2021 9:21:59 AM By: Carlene Coria RN Entered By: Carlene Coria on 06/09/2021 08:54:13 Mario Baxter ([id number]) -------------------------------------------------------------------------------- Patient/Caregiver Education Details Patient Name: Mario Baxter, Mario Baxter. Date of Service: 06/09/2021 8:45 AM Medical Record Number: [id number] Patient Account Number: [id number] Date of Birth/Gender: [birth year] (88 y.o. M) Treating RN: Carlene Coria Primary Care Physician: Dorisann Frames Other Clinician: Referring Physician: Dorisann Frames Treating Physician/Extender: Skipper Cliche in Treatment: 1 Education Assessment Education Provided To: Patient Education Topics Provided Venous: Methods: Explain/Verbal Responses: State content  correctly Electronic Signature(s) Signed:  06/09/2021 9:21:59 AM By: Carlene Coria RN Entered By: Carlene Coria on 06/09/2021 09:08:30 Mario Baxter, Mario Baxter ([id number]) -------------------------------------------------------------------------------- Wound Assessment Details Patient Name: Mario Baxter, Mario Baxter. Date of Service: 06/09/2021 8:45 AM Medical Record Number: [id number] Patient Account Number: [id number] Date of Birth/Sex: 1930/08/24 (88 y.o. M) Treating RN: Carlene Coria Primary Care Eliora Nienhuis: Dorisann Frames Other Clinician: Referring Kaspar Albornoz: Dorisann Frames Treating Larnce Schnackenberg/Extender: Skipper Cliche in Treatment: 1 Wound Status Wound Number: 1 Primary Etiology: Venous Leg Ulcer Wound Location: Right, Midline, Anterior Baxter Leg Wound Status: Open Wounding Event: Trauma Comorbid History: Cataracts, Glaucoma Date Acquired: 05/22/2021 Weeks Of Treatment: 1 Clustered Wound: No Photos Wound Measurements Length: (cm) 3.6 Width: (cm) 1.5 Depth: (cm) 0.1 Area: (cm) 4.241 Volume: (cm) 0.424 % Reduction in Area: 11.8% % Reduction in Volume: 11.9% Epithelialization: None Tunneling: No Undermining: No Wound Description Classification: Full Thickness Without Exposed Support Structu Wound Margin: Flat and Intact Exudate Amount: Medium Exudate Type: Serous Exudate Color: amber res Foul Odor After Cleansing: No Slough/Fibrino Yes Wound Bed Granulation Amount: Large (67-100%) Exposed Structure Granulation Quality: Red Fascia Exposed: No Necrotic Amount: Small (1-33%) Fat Layer (Subcutaneous Tissue) Exposed: Yes Tendon Exposed: No Muscle Exposed: No Joint Exposed: No Bone Exposed: No Treatment Notes Wound #1 (Baxter Leg) Wound Laterality: Right, Midline, Anterior Cleanser Peri-Wound Care Topical JOHNNELL, LIOU ([id number]) Primary Dressing Promogran Matrix 4.34 (in) Discharge Instruction: Moisten w/normal saline or sterile water; Cover wound as directed. Do not remove from wound bed. Secondary  Dressing Gauze Discharge Instruction: As directed: dry, moistened with saline or moistened with Dakins Solution Secured With Compression Wrap Profore Lite LF 3 Multilayer Compression Bandaging System Discharge Instruction: Apply 3 multi-layer wrap as prescribed. Compression Stockings Add-Ons Electronic Signature(s) Signed: 06/09/2021 9:21:59 AM By: Carlene Coria RN Entered By: Carlene Coria on 06/09/2021 08:59:43 EYAN, HAGOOD ([id number]) -------------------------------------------------------------------------------- Vitals Details Patient Name: EVAN, MACKIE. Date of Service: 06/09/2021 8:45 AM Medical Record Number: [id number] Patient Account Number: [id number] Date of Birth/Sex: Feb 26, 1931 (88 y.o. M) Treating RN: Carlene Coria Primary Care Ilyas Lipsitz: Dorisann Frames Other Clinician: Referring Brad Lieurance: Dorisann Frames Treating Capricia Serda/Extender: Skipper Cliche in Treatment: 1 Vital Signs Time Taken: 08:53 Temperature (F): 98.5 Height (in): 72 Pulse (bpm): 67 Weight (lbs): 215 Respiratory Rate (breaths/min): 18 Body Mass Index (BMI): 29.2 Blood Pressure (mmHg): 130/80 Reference Range: 80 - 120 mg / dl Electronic Signature(s) Signed: 06/09/2021 9:21:59 AM By: Carlene Coria RN Entered By: Carlene Coria on 06/09/2021 08:54:06

## 2021-06-16 ENCOUNTER — Encounter: Payer: Medicare Other | Admitting: Internal Medicine

## 2021-06-16 ENCOUNTER — Ambulatory Visit: Payer: Medicare Other | Admitting: Physician Assistant

## 2021-06-16 ENCOUNTER — Other Ambulatory Visit: Payer: Self-pay

## 2021-06-16 DIAGNOSIS — L97812 Non-pressure chronic ulcer of other part of right lower leg with fat layer exposed: Secondary | ICD-10-CM | POA: Diagnosis not present

## 2021-06-16 NOTE — Progress Notes (Signed)
RAJVEER, HANDLER ([id number]) Visit Report for 06/16/2021 HPI Details Patient Name: Mario Baxter, Mario Baxter. Date of Service: 06/16/2021 9:45 AM Medical Record Number: [id number] Patient Account Number: [id number] Date of Birth/Sex: 06-28-1930 (88 y.o. M) Treating RN: Levora Dredge Primary Care Provider: Dorisann Frames Other Clinician: Referring Provider: Dorisann Frames Treating Provider/Extender: Tito Dine in Treatment: 2 History of Present Illness HPI Description: 06/02/2021 upon evaluation patient presents for initial inspection here in the clinic concerning issues that has been having with a wound on his right Baxter extremity. This is something that has been going on since around January 23. He is also been on antibiotics since the 30th that is doxycycline. With that being said no wound culture was taken prior to the initiation of antibiotic therapy. Currently the wound is still extremely red around the edges and very tender to touch as well. Fortunately I do not see any signs of active infection at this time. They have been soaking it with Epson salt for some time and using Neosporin now. Prior to that they were cleaning it with peroxide but it was not getting better so they switched away from that. 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 The patient does have a history of chronic venous insufficiency, hypertension, but otherwise has really no major medical problems which is good news. 06/09/2021 upon evaluation today patient appears to be doing well with regard to his wound. He has been tolerating the dressing changes without complication. Fortunately I do not see any signs of active infection locally or systemically at this point. 2/17; patient admitted the clinic 2 weeks ago with a wound on the right anterior mid tibia of uncertain etiology. He does have chronic venous insufficiency and has had previous ablations on the right leg. He  has had courses of antibiotics. We have been using Promogran under 3 layer compression. Electronic Signature(s) Signed: 06/16/2021 3:30:14 PM By: Linton Ham MD Entered By: Linton Ham on 06/16/2021 10:54:23 Mario Baxter ([id number]) -------------------------------------------------------------------------------- Physical Exam Details Patient Name: Mario Baxter, Mario Baxter. Date of Service: 06/16/2021 9:45 AM Medical Record Number: [id number] Patient Account Number: [id number] Date of Birth/Sex: 1930-12-13 (88 y.o. M) Treating RN: Levora Dredge Primary Care Provider: Dorisann Frames Other Clinician: Referring Provider: Dorisann Frames Treating Provider/Extender: Tito Dine in Treatment: 2 Constitutional Patient is hypertensive.. Pulse regular and within target range for patient.Marland Kitchen Respirations regular, non-labored and within target range.. Temperature is normal and within the target range for the patient.Marland Kitchen appears in no distress. Cardiovascular Pedal pulses are intact. Signs of significant venous hypertension. Some erythema around the wound but no palpable tenderness.. Notes Wound exam; very healthy looking wound which is measuring considerably smaller in terms of surface area there is no measurable depth no evidence of surrounding infection very healthy looking granulation. No mechanical debridement is required Electronic Signature(s) Signed: 06/16/2021 3:30:14 PM By: Linton Ham MD Entered By: Linton Ham on 06/16/2021 10:55:36 Mario Baxter ([id number]) -------------------------------------------------------------------------------- Physician Orders Details Patient Name: Mario Baxter, Mario Baxter. Date of Service: 06/16/2021 9:45 AM Medical Record Number: [id number] Patient Account Number: [id number] Date of Birth/Sex: April 29, 1931 (88 y.o. M) Treating RN: Levora Dredge Primary Care Provider: Dorisann Frames Other Clinician: Referring Provider: Dorisann Frames Treating Provider/Extender: Tito Dine in Treatment: 2 Verbal / Phone Orders: No Diagnosis Coding Follow-up Appointments o Return Appointment in 1 week. o Nurse Visit as needed Bathing/ Shower/ Hygiene o May shower; gently cleanse wound with antibacterial soap, rinse  and pat dry prior to dressing wounds Medications-Please add to medication list. o P.O. Antibiotics - continue antibiotics Wound Treatment Wound #1 - Baxter Leg Wound Laterality: Right, Midline, Anterior Primary Dressing: Promogran Matrix 4.34 (in) (Generic) Discharge Instructions: Moisten w/normal saline or sterile water; Cover wound as directed. Do not remove from wound bed. Secondary Dressing: Gauze Discharge Instructions: As directed: dry, moistened with saline or moistened with Dakins Solution Compression Wrap: Profore Lite LF 3 Multilayer Compression Bandaging System Discharge Instructions: Apply 3 multi-layer wrap as prescribed. Electronic Signature(s) Signed: 06/16/2021 3:22:01 PM By: Levora Dredge Signed: 06/16/2021 3:30:14 PM By: Linton Ham MD Entered By: Levora Dredge on 06/16/2021 10:42:23 Mario Baxter, Mario Baxter ([id number]) -------------------------------------------------------------------------------- Problem List Details Patient Name: Mario Baxter, Mario Baxter. Date of Service: 06/16/2021 9:45 AM Medical Record Number: [id number] Patient Account Number: [id number] Date of Birth/Sex: September 28, 1930 (88 y.o. 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 Date of Service: 06/16/2021 9:45 AM Medical Record Number: [id number] Patient Account Number: [id number] Date of Birth/Sex: 06/10/1930 (88 y.o. M) Treating RN: Levora Dredge Primary Care Provider: Dorisann Frames Other Clinician: Referring Provider: Dorisann Frames Treating Provider/Extender: Tito Dine in Treatment: 2 Subjective History of Present Illness (HPI) 06/02/2021 upon evaluation patient presents for initial inspection here in the clinic concerning issues that has been having with a wound on his right Baxter extremity. This is something that has been going on since around January 23. He is also been on antibiotics since the 30th that is doxycycline. With that being said no wound culture was taken prior to the initiation of antibiotic therapy. Currently the wound is still extremely red around the edges and very tender to touch as well. Fortunately I do not see any signs of active infection at this time. They have been soaking it with Epson salt for some time and using Neosporin now. Prior to that they were cleaning it with peroxide but it was not getting better so they switched away from that. 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 The patient does have a history of chronic venous insufficiency, hypertension, but otherwise has really no major medical problems which is good news. 06/09/2021 upon evaluation today patient appears to be doing well with regard to his wound. He has been tolerating the dressing changes without complication. Fortunately I do not see any signs of active infection locally  or systemically at this point. 2/17; patient admitted the clinic 2 weeks ago with a wound on the right anterior mid tibia of uncertain etiology. He does have chronic venous insufficiency and has had previous ablations on the right leg. He has had courses of antibiotics. We have been using Promogran under 3 layer compression. Objective Constitutional Patient is hypertensive.. Pulse regular and within target range for patient.Marland Kitchen Respirations regular, non-labored and within target range.. Temperature is normal and within the target range for the patient.Marland Kitchen appears in no distress. Vitals Time Taken: 9:49  AM, Height: 72 in, Weight: 215 lbs, BMI: 29.2, Temperature: 97.7 F, Pulse: 54 bpm, Respiratory Rate: 18 breaths/min, Blood Pressure: 167/83 mmHg. Cardiovascular Pedal pulses are intact. Signs of significant venous hypertension. Some erythema around the wound but no palpable tenderness.. General Notes: Wound exam; very healthy looking wound which is measuring considerably smaller in terms of surface area there is no measurable depth no evidence of surrounding infection very healthy looking granulation. No mechanical debridement is required Integumentary (Hair, Skin) Wound #1 status is Open. Original cause of wound was Trauma. The date acquired was: 05/22/2021. The wound has been in treatment 2 weeks. The wound is located on the Right,Midline,Anterior Baxter Leg. The wound measures 2.4cm length x 0.7cm width x 0.1cm depth; 1.319cm^2 area and 0.132cm^3 volume. There is Fat Layer (Subcutaneous Tissue) exposed. There is no tunneling or undermining noted. There is a medium amount of serous drainage noted. The wound margin is flat and intact. There is large (67-100%) red, friable granulation within the wound bed. There is a small (1-33%) amount of necrotic tissue within the wound bed. 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 ([id number]) Procedures Wound #1 Pre-procedure diagnosis of Wound #1 is a Venous Leg Ulcer located on the Right,Midline,Anterior Baxter Leg . There was a Three Layer Compression Therapy Procedure with a pre-treatment ABI of 1.9 by Levora Dredge, RN. Post procedure Diagnosis Wound #1: Same as Pre-Procedure Plan Follow-up Appointments: Return Appointment in 1 week. Nurse Visit as needed Bathing/ Shower/ Hygiene: May shower; gently cleanse wound with antibacterial soap, rinse and pat dry prior to dressing wounds Medications-Please add to medication list.: P.O. Antibiotics - continue antibiotics WOUND #1: - Baxter Leg Wound Laterality: Right, Midline, Anterior Primary Dressing: Promogran Matrix 4.34 (in) (Generic) Discharge Instructions: Moisten w/normal saline or sterile water; Cover wound as directed. Do not remove from wound bed. Secondary Dressing: Gauze Discharge Instructions: As directed: dry, moistened with saline or moistened with Dakins Solution Compression Wrap: Profore Lite LF 3 Multilayer Compression Bandaging System Discharge Instructions: Apply 3 multi-layer wrap as prescribed. 1. We continued with the Promogran under 3 layer compression 2. This was initially a wound of uncertain etiology but common things being common probably minor/unrecognizable trauma in the setting of chronic venous insufficiency. I did bring up the idea of some form of compression stocking or support stockings. Electronic Signature(s) Signed: 06/16/2021 3:30:14 PM By: Linton Ham MD Entered By: Linton Ham on 06/16/2021 10:56:46 Mario Baxter ([id number]) -------------------------------------------------------------------------------- [place] Details Patient Name: Mario Baxter, Mario Baxter. Date of Service: 06/16/2021 Medical Record Number: [id number] Patient Account Number: [id number] Date of Birth/Sex: 07/16/1930  (88 y.o. 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 [place] (primary) hypertension Facility Procedures CPT4 Code: 08657846 Description: (Facility Use Only) [phone] - APPLY MULTLAY COMPRS LWR RT LEG Modifier: Quantity: 1 Physician Procedures CPT4 Code: 4132440 Description: 10272 - WC PHYS LEVEL 3 - EST PT Modifier: Quantity: 1 CPT4 Code: Description: ICD-10 Diagnosis Description Z36.644 Non-pressure chronic ulcer of other part of right Baxter leg with fat la I87.2 Venous insufficiency (chronic) (peripheral) Modifier: yer exposed Quantity: Electronic Signature(s) Signed: 06/16/2021 3:30:14 PM By: Linton Ham MD  Entered By: Linton Ham on 06/16/2021 10:57:23

## 2021-06-16 NOTE — Progress Notes (Signed)
HOYLE, BARKDULL ([id number]) Visit Report for 06/16/2021 Arrival Information Details Patient Name: Mario Baxter, Mario Baxter. Date of Service: 06/16/2021 9:45 AM Medical Record Number: [id number] Patient Account Number: [id number] Date of Birth/Sex: 11-Oct-1930 (88 y.o. M) Treating RN: Levora Dredge Primary Care Reiner Loewen: Dorisann Frames Other Clinician: Referring Brandon Wiechman: Dorisann Frames Treating Madolin Twaddle/Extender: Tito Dine in Treatment: 2 Visit Information History Since Last Visit Added or deleted any medications: No Patient Arrived: Ambulatory Any new allergies or adverse reactions: No Arrival Time: 09:46 Had a fall or experienced change in No Accompanied By: wife activities of daily living that may affect Transfer Assistance: None risk of falls: Patient Identification Verified: Yes Hospitalized since last visit: No Secondary Verification Process Completed: Yes Has Dressing in Place as Prescribed: Yes Patient Requires Transmission-Based No Has Compression in Place as Prescribed: Yes Precautions: Pain Present Now: No Patient Has Alerts: Yes Patient Alerts: Borderline DM diet cont. Electronic Signature(s) Signed: 06/16/2021 3:22:01 PM By: Levora Dredge Entered By: Levora Dredge on 06/16/2021 09:49:26 Mario Baxter ([id number]) -------------------------------------------------------------------------------- Clinic Level of Care Assessment Details Patient Name: Mario Baxter, Mario Baxter. Date of Service: 06/16/2021 9:45 AM Medical Record Number: [id number] Patient Account Number: [id number] Date of Birth/Sex: November 22, 1930 (88 y.o. M) Treating RN: Levora Dredge Primary Care Yaneth Fairbairn: Dorisann Frames Other Clinician: Referring Oliver Neuwirth: Dorisann Frames Treating Livie Vanderhoof/Extender: Tito Dine in Treatment: 2 Clinic Level of Care Assessment Items TOOL 1 Quantity Score []  - Use when EandM and Procedure is performed on INITIAL visit 0 ASSESSMENTS - Nursing  Assessment / Reassessment []  - General Physical Exam (combine w/ comprehensive assessment (listed just below) when performed on new 0 pt. evals) []  - 0 Comprehensive Assessment (HX, ROS, Risk Assessments, Wounds Hx, etc.) ASSESSMENTS - Wound and Skin Assessment / Reassessment []  - Dermatologic / Skin Assessment (not related to wound area) 0 ASSESSMENTS - Ostomy and/or Continence Assessment and Care []  - Incontinence Assessment and Management 0 []  - 0 Ostomy Care Assessment and Management (repouching, etc.) PROCESS - Coordination of Care []  - Simple Patient / Family Education for ongoing care 0 []  - 0 Complex (extensive) Patient / Family Education for ongoing care []  - 0 Staff obtains Programmer, systems, Records, Test Results / Process Orders []  - 0 Staff telephones HHA, Nursing Homes / Clarify orders / etc []  - 0 Routine Transfer to another Facility (non-emergent condition) []  - 0 Routine Hospital Admission (non-emergent condition) []  - 0 New Admissions / Biomedical engineer / Ordering NPWT, Apligraf, etc. []  - 0 Emergency Hospital Admission (emergent condition) PROCESS - Special Needs []  - Pediatric / Minor Patient Management 0 []  - 0 Isolation Patient Management []  - 0 Hearing / Language / Visual special needs []  - 0 Assessment of Community assistance (transportation, D/C planning, etc.) []  - 0 Additional assistance / Altered mentation []  - 0 Support Surface(s) Assessment (bed, cushion, seat, etc.) INTERVENTIONS - Miscellaneous []  - External ear exam 0 []  - 0 Patient Transfer (multiple staff / Civil Service fast streamer / Similar devices) []  - 0 Simple Staple / Suture removal (25 or less) []  - 0 Complex Staple / Suture removal (26 or more) []  - 0 Hypo/Hyperglycemic Management (do not check if billed separately) []  - 0 Ankle / Brachial Index (ABI) - do not check if billed separately Has the patient been seen at the hospital within the last three years: Yes Total Score: 0 Level Of  Care: ____ Mario Baxter ([id number]) Electronic Signature(s) Signed: 06/16/2021 3:22:01 PM By: Levora Dredge Entered By: Levora Dredge on 06/16/2021  10:42:56 Mario Baxter, Mario Baxter ([id number]) -------------------------------------------------------------------------------- Compression Therapy Details Patient Name: Mario Baxter, Mario Baxter. Date of Service: 06/16/2021 9:45 AM Medical Record Number: [id number] Patient Account Number: [id number] Date of Birth/Sex: 1931/03/12 (88 y.o. M) Treating RN: Levora Dredge Primary Care Jaicee Michelotti: Dorisann Frames Other Clinician: Referring Derrek Puff: Dorisann Frames Treating Bing Duffey/Extender: Tito Dine in Treatment: 2 Compression Therapy Performed for Wound Assessment: Wound #1 Right,Midline,Anterior Baxter Leg Performed By: Clinician Levora Dredge, RN Compression Type: Three Layer Pre Treatment ABI: 1.9 Post Procedure Diagnosis Same as Pre-procedure Electronic Signature(s) Signed: 06/16/2021 3:22:01 PM By: Levora Dredge Entered By: Levora Dredge on 06/16/2021 10:42:45 Mario Baxter ([id number]) -------------------------------------------------------------------------------- Encounter Discharge Information Details Patient Name: Mario Baxter, Mario Baxter. Date of Service: 06/16/2021 9:45 AM Medical Record Number: [id number] Patient Account Number: [id number] Date of Birth/Sex: [birth year] (88 y.o. M) Treating RN: Levora Dredge Primary Care Yaw Escoto: Dorisann Frames Other Clinician: Referring Tava Peery: Dorisann Frames Treating Yaret Hush/Extender: Tito Dine in Treatment: 2 Encounter Discharge Information Items Discharge Condition: Stable Ambulatory Status: Ambulatory Discharge Destination: Home Transportation: Private Auto Accompanied By: wife Schedule Follow-up Appointment: Yes Clinical Summary of Care: Electronic Signature(s) Signed: 06/16/2021 3:22:01 PM By: Levora Dredge Entered By: Levora Dredge on 06/16/2021  10:44:15 Mario Baxter ([id number]) -------------------------------------------------------------------------------- Baxter Extremity Assessment Details Patient Name: Mario Baxter, Mario Baxter. Date of Service: 06/16/2021 9:45 AM Medical Record Number: [id number] Patient Account Number: [id number] Date of Birth/Sex: [birth year] (88 y.o. M) Treating RN: Levora Dredge Primary Care Vito Beg: Dorisann Frames Other Clinician: Referring Tia Hieronymus: Dorisann Frames Treating Idelle Reimann/Extender: Tito Dine in Treatment: 2 Edema Assessment Assessed: [Left: No] [Right: No] Edema: [Left: Ye] [Right: s] Calf Left: Right: Point of Measurement: 34 cm From Medial Instep 36 cm Ankle Left: Right: Point of Measurement: 13 cm From Medial Instep 23 cm Vascular Assessment Pulses: Dorsalis Pedis Palpable: [Right:Yes] Posterior Tibial Palpable: [Right:Yes] Electronic Signature(s) Signed: 06/16/2021 3:22:01 PM By: Levora Dredge Entered By: Levora Dredge on 06/16/2021 10:04:26 Mario Baxter ([id number]) -------------------------------------------------------------------------------- Multi Wound Chart Details Patient Name: Mario Baxter. Date of Service: 06/16/2021 9:45 AM Medical Record Number: [id number] Patient Account Number: [id number] Date of Birth/Sex: [birth year] (88 y.o. M) Treating RN: Levora Dredge Primary Care Valarie Farace: Dorisann Frames Other Clinician: Referring Shunda Rabadi: Dorisann Frames Treating Jamiesha Victoria/Extender: Tito Dine in Treatment: 2 Vital Signs Height(in): 72 Pulse(bpm): 54 Weight(lbs): 215 Blood Pressure(mmHg): 167/83 Body Mass Index(BMI): 29.2 Temperature(F): 97.7 Respiratory Rate(breaths/min): 18 Photos: [N/A:N/A] Wound Location: Right, Midline, Anterior Baxter Leg N/A N/A Wounding Event: Trauma N/A N/A Primary Etiology: Venous Leg Ulcer N/A N/A Comorbid History: Cataracts, Glaucoma N/A N/A Date Acquired: 05/22/2021 N/A N/A Weeks of Treatment:  2 N/A N/A Wound Status: Open N/A N/A Wound Recurrence: No N/A N/A Measurements L x W x D (cm) 2.4x0.7x0.1 N/A N/A Area (cm) : 1.319 N/A N/A Volume (cm) : 0.132 N/A N/A % Reduction in Area: 72.60% N/A N/A % Reduction in Volume: 72.60% N/A N/A Classification: Full Thickness Without Exposed N/A N/A Support Structures Exudate Amount: Medium N/A N/A Exudate Type: Serous N/A N/A Exudate Color: amber N/A N/A Wound Margin: Flat and Intact N/A N/A Granulation Amount: Large (67-100%) N/A N/A Granulation Quality: Red, Friable N/A N/A Necrotic Amount: Small (1-33%) N/A N/A Exposed Structures: Fat Layer (Subcutaneous Tissue): N/A N/A Yes Fascia: No Tendon: No Muscle: No Joint: No Bone: No Epithelialization: Medium (34-66%) N/A N/A Treatment Notes Electronic Signature(s) Signed: 06/16/2021 3:22:01 PM By: Levora Dredge Entered By: Levora Dredge on 06/16/2021 10:38:08 Mario Baxter ([id number]) -------------------------------------------------------------------------------- Multi-Disciplinary Care Plan Details Patient  Name: Mario Baxter, Mario Baxter. Date of Service: 06/16/2021 9:45 AM Medical Record Number: [id number] Patient Account Number: [id number] Date of Birth/Sex: Oct 24, 1930 (88 y.o. M) Treating RN: Levora Dredge Primary Care Joslin Doell: Dorisann Frames Other Clinician: Referring Esma Kilts: Dorisann Frames Treating Keevan Wolz/Extender: Tito Dine in Treatment: 2 Active Inactive Necrotic Tissue Nursing Diagnoses: Impaired tissue integrity related to necrotic/devitalized tissue Knowledge deficit related to management of necrotic/devitalized tissue Goals: Necrotic/devitalized tissue will be minimized in the wound bed Date Initiated: 06/02/2021 Target Resolution Date: 06/02/2021 Goal Status: Active Patient/caregiver will verbalize understanding of reason and process for debridement of necrotic tissue Date Initiated: 06/02/2021 Target Resolution Date: 06/02/2021 Goal Status:  Active Interventions: Assess patient pain level pre-, during and post procedure and prior to discharge Provide education on necrotic tissue and debridement process Treatment Activities: Excisional debridement : 06/02/2021 Notes: Orientation to the Wound Care Program Nursing Diagnoses: Knowledge deficit related to the wound healing center program Goals: Patient/caregiver will verbalize understanding of the [place] Date Initiated: 06/02/2021 Target Resolution Date: 06/02/2021 Goal Status: Active Interventions: Provide education on orientation to the wound center Notes: Venous Leg Ulcer Nursing Diagnoses: Actual venous Insuffiency (use after diagnosis is confirmed) Knowledge deficit related to disease process and management Potential for venous Insuffiency (use before diagnosis confirmed) Goals: Patient will maintain optimal edema control Date Initiated: 06/02/2021 Target Resolution Date: 06/02/2021 Goal Status: Active Patient/caregiver will verbalize understanding of disease process and disease management Date Initiated: 06/02/2021 Target Resolution Date: 06/02/2021 Goal Status: Active Verify adequate tissue perfusion prior to therapeutic compression application Date Initiated: 06/02/2021 Target Resolution Date: 06/02/2021 RHIAN, FUNARI ([id number]) Goal Status: Active Interventions: Assess peripheral edema status every visit. Compression as ordered Provide education on venous insufficiency Notes: Wound/Skin Impairment Nursing Diagnoses: Impaired tissue integrity Goals: Patient/caregiver will verbalize understanding of skin care regimen Date Initiated: 06/02/2021 Target Resolution Date: 06/02/2021 Goal Status: Active Ulcer/skin breakdown will have a volume reduction of 30% by week 4 Date Initiated: 06/02/2021 Target Resolution Date: 06/02/2021 Goal Status: Active Interventions: Assess patient/caregiver ability to obtain necessary supplies Assess patient/caregiver  ability to perform ulcer/skin care regimen upon admission and as needed Assess ulceration(s) every visit Treatment Activities: Referred to DME Amenda Duclos for dressing supplies : 06/02/2021 Skin care regimen initiated : 06/02/2021 Topical wound management initiated : 06/02/2021 Notes: Electronic Signature(s) Signed: 06/16/2021 3:22:01 PM By: Levora Dredge Entered By: Levora Dredge on 06/16/2021 10:37:59 Mario Baxter ([id number]) -------------------------------------------------------------------------------- Pain Assessment Details Patient Name: Mario Baxter, Mario Baxter. Date of Service: 06/16/2021 9:45 AM Medical Record Number: [id number] Patient Account Number: [id number] Date of Birth/Sex: July 29, 1930 (88 y.o. M) Treating RN: Levora Dredge Primary Care Mcdonald Reiling: Dorisann Frames Other Clinician: Referring Kee Drudge: Dorisann Frames Treating Hakiem Malizia/Extender: Tito Dine in Treatment: 2 Active Problems Location of Pain Severity and Description of Pain Patient Has Paino No Site Locations Rate the pain. Current Pain Level: 0 Pain Management and Medication Current Pain Management: Electronic Signature(s) Signed: 06/16/2021 3:22:01 PM By: Levora Dredge Entered By: Levora Dredge on 06/16/2021 09:52:49 Mario Baxter ([id number]) -------------------------------------------------------------------------------- Patient/Caregiver Education Details Patient Name: Mario Baxter, Mario Baxter. Date of Service: 06/16/2021 9:45 AM Medical Record Number: [id number] Patient Account Number: [id number] Date of Birth/Gender: 1930/09/03 (88 y.o. M) Treating RN: Levora Dredge Primary Care Physician: Dorisann Frames Other Clinician: Referring Physician: Dorisann Frames Treating Physician/Extender: Tito Dine in Treatment: 2 Education Assessment Education Provided To: Patient Education Topics Provided Wound/Skin Impairment: Handouts: Caring for Your Ulcer Methods:  Demonstration Responses: State content correctly Electronic Signature(s) Signed: 06/16/2021 3:22:01  PM By: Levora Dredge Entered By: Levora Dredge on 06/16/2021 10:43:17 Mario Baxter ([id number]) -------------------------------------------------------------------------------- Wound Assessment Details Patient Name: Mario Baxter, Mario Baxter. Date of Service: 06/16/2021 9:45 AM Medical Record Number: [id number] Patient Account Number: [id number] Date of Birth/Sex: [birth year] (88 y.o. M) Treating RN: Levora Dredge Primary Care Valecia Beske: Dorisann Frames Other Clinician: Referring Hanish Laraia: Dorisann Frames Treating Argil Mahl/Extender: Tito Dine in Treatment: 2 Wound Status Wound Number: 1 Primary Etiology: Venous Leg Ulcer Wound Location: Right, Midline, Anterior Baxter Leg Wound Status: Open Wounding Event: Trauma Comorbid History: Cataracts, Glaucoma Date Acquired: 05/22/2021 Weeks Of Treatment: 2 Clustered Wound: No Photos Wound Measurements Length: (cm) 2.4 Width: (cm) 0.7 Depth: (cm) 0.1 Area: (cm) 1.319 Volume: (cm) 0.132 % Reduction in Area: 72.6% % Reduction in Volume: 72.6% Epithelialization: Medium (34-66%) Tunneling: No Undermining: No Wound Description Classification: Full Thickness Without Exposed Support Structu Wound Margin: Flat and Intact Exudate Amount: Medium Exudate Type: Serous Exudate Color: amber res Foul Odor After Cleansing: No Slough/Fibrino Yes Wound Bed Granulation Amount: Large (67-100%) Exposed Structure Granulation Quality: Red, Friable Fascia Exposed: No Necrotic Amount: Small (1-33%) Fat Layer (Subcutaneous Tissue) Exposed: Yes Tendon Exposed: No Muscle Exposed: No Joint Exposed: No Bone Exposed: No Treatment Notes Wound #1 (Baxter Leg) Wound Laterality: Right, Midline, Anterior Cleanser Peri-Wound Care Topical Mario Baxter, Mario Baxter ([id number]) Primary Dressing Promogran Matrix 4.34 (in) Discharge Instruction: Moisten  w/normal saline or sterile water; Cover wound as directed. Do not remove from wound bed. Secondary Dressing Gauze Discharge Instruction: As directed: dry, moistened with saline or moistened with Dakins Solution Secured With Compression Wrap Profore Lite LF 3 Multilayer Compression Bandaging System Discharge Instruction: Apply 3 multi-layer wrap as prescribed. Compression Stockings Add-Ons Electronic Signature(s) Signed: 06/16/2021 10:23:47 AM By: Levora Dredge Entered By: Levora Dredge on 06/16/2021 10:23:47 AYDEN, HARDWICK ([id number]) -------------------------------------------------------------------------------- Vitals Details Patient Name: CROSBY, ORIORDAN. Date of Service: 06/16/2021 9:45 AM Medical Record Number: [id number] Patient Account Number: [id number] Date of Birth/Sex: February 03, 1931 (88 y.o. M) Treating RN: Levora Dredge Primary Care Shakeda Pearse: Dorisann Frames Other Clinician: Referring Aliceson Dolbow: Dorisann Frames Treating Ester Hilley/Extender: Tito Dine in Treatment: 2 Vital Signs Time Taken: 09:49 Temperature (F): 97.7 Height (in): 72 Pulse (bpm): 54 Weight (lbs): 215 Respiratory Rate (breaths/min): 18 Body Mass Index (BMI): 29.2 Blood Pressure (mmHg): 167/83 Reference Range: 80 - 120 mg / dl Electronic Signature(s) Signed: 06/16/2021 3:22:01 PM By: Levora Dredge Entered By: Levora Dredge on 06/16/2021 09:51:33

## 2021-06-23 ENCOUNTER — Encounter: Payer: Medicare Other | Admitting: Physician Assistant

## 2021-06-23 ENCOUNTER — Other Ambulatory Visit: Payer: Self-pay

## 2021-06-23 ENCOUNTER — Ambulatory Visit: Payer: Medicare Other | Admitting: Physician Assistant

## 2021-06-23 DIAGNOSIS — L97812 Non-pressure chronic ulcer of other part of right lower leg with fat layer exposed: Secondary | ICD-10-CM | POA: Diagnosis not present

## 2021-06-23 NOTE — Progress Notes (Addendum)
LAYNE, DILAURO ([id number]) Visit Report for 06/23/2021 Chief Complaint Document Details Patient Name: Mario Baxter, Mario Baxter. Date of Service: 06/23/2021 10:15 AM Medical Record Number: [id number] Patient Account Number: [id number] Date of Birth/Sex: 05-17-1930 (88 y.o. M) Treating RN: Carlene Coria Primary Care Provider: Dorisann Frames Other Clinician: Referring Provider: Dorisann Frames Treating Provider/Extender: Skipper Cliche in Treatment: 3 Information Obtained from: Patient Chief Complaint Right LE Ulcer Electronic Signature(s) Signed: 06/23/2021 10:19:17 AM By: Worthy Keeler PA-C Entered By: Worthy Keeler on 06/23/2021 10:19:16 [name] ([id number]) -------------------------------------------------------------------------------- HPI Details Patient Name: Mario Baxter, Mario Baxter. Date of Service: 06/23/2021 10:15 AM Medical Record Number: [id number] Patient Account Number: [id number] Date of Birth/Sex: March 09, 1931 (88 y.o. M) Treating RN: Carlene Coria Primary Care Provider: Dorisann Frames Other Clinician: Referring Provider: Dorisann Frames Treating Provider/Extender: Skipper Cliche in Treatment: 3 History of Present Illness HPI Description: 06/02/2021 upon evaluation patient presents for initial inspection here in the clinic concerning issues that has been having with a wound on his right lower extremity. This is something that has been going on since around January 23. He is also been on antibiotics since the 30th that is doxycycline. With that being said no wound culture was taken prior to the initiation of antibiotic therapy. Currently the wound is still extremely red around the edges and very tender to touch as well. Fortunately I do not see any signs of active infection at this time. They have been soaking it with Epson salt for some time and using Neosporin now. Prior to that they were cleaning it with peroxide but it was not getting better so they switched away from  that. 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 The patient does have a history of chronic venous insufficiency, hypertension, but otherwise has really no major medical problems which is good news. 06/09/2021 upon evaluation today patient appears to be doing well with regard to his wound. He has been tolerating the dressing changes without complication. Fortunately I do not see any signs of active infection locally or systemically at this point. 2/17; patient admitted the clinic 2 weeks ago with a wound on the right anterior mid tibia of uncertain etiology. He does have chronic venous insufficiency and has had previous ablations on the right leg. He has had courses of antibiotics. We have been using Promogran under 3 layer compression. 06/23/2021 upon evaluation today patient appears to be doing well with regard to his wound. In fact it appears to be completely healed based on what I am seeing. Electronic Signature(s) Signed: 06/23/2021 10:33:23 AM By: Worthy Keeler PA-C Entered By: Worthy Keeler on 06/23/2021 10:33:23 [name], [name] ([id number]) -------------------------------------------------------------------------------- Physical Exam Details Patient Name: Mario Baxter, Mario Baxter. Date of Service: 06/23/2021 10:15 AM Medical Record Number: [id number] Patient Account Number: [id number] Date of Birth/Sex: 07-24-1930 (88 y.o. M) Treating RN: Carlene Coria Primary Care Provider: Dorisann Frames Other Clinician: Referring Provider: Dorisann Frames Treating Provider/Extender: Skipper Cliche in Treatment: 3 Constitutional Well-nourished and well-hydrated in no acute distress. Respiratory normal breathing without difficulty. Psychiatric this patient is able to make decisions and demonstrates good insight into disease process. Alert and Oriented x 3. pleasant and cooperative. Notes Upon inspection patient's wound bed showed evidence of good  epithelization at this point. He actually is completely closed and I think ready for discharge today. Electronic Signature(s) Signed: 06/23/2021 10:33:39 AM By: Worthy Keeler PA-C Entered By: Worthy Keeler on 06/23/2021 10:33:38 Mario Baxter,  Mario Baxter ([id number]) -------------------------------------------------------------------------------- Physician Orders Details Patient Name: Mario Baxter, Mario Baxter. Date of Service: 06/23/2021 10:15 AM Medical Record Number: [id number] Patient Account Number: [id number] Date of Birth/Sex: 1930/08/01 (88 y.o. M) Treating RN: Carlene Coria Primary Care Provider: Dorisann Frames Other Clinician: Referring Provider: Dorisann Frames Treating Provider/Extender: Skipper Cliche in Treatment: 3 Verbal / Phone Orders: No Diagnosis Coding ICD-10 Coding Code Description I87.2 Venous insufficiency (chronic) (peripheral) L97.812 Non-pressure chronic ulcer of other part of right lower leg with fat layer exposed I10 Essential (primary) hypertension Discharge From [facility] o Discharge from [place] Treatment Complete - apply protection times 1 week Wound Treatment Electronic Signature(s) Signed: 06/23/2021 4:05:24 PM By: Worthy Keeler PA-C Signed: 06/26/2021 8:01:43 AM By: Carlene Coria RN Entered By: Carlene Coria on 06/23/2021 10:31:34 [name] ([id number]) -------------------------------------------------------------------------------- Problem List Details Patient Name: Mario Baxter, Mario Baxter. Date of Service: 06/23/2021 10:15 AM Medical Record Number: [id number] Patient Account Number: [id number] Date of Birth/Sex: [birth year] (88 y.o. M) Treating RN: Carlene Coria Primary Care Provider: Dorisann Frames Other Clinician: Referring Provider: Dorisann Frames Treating Provider/Extender: Skipper Cliche in Treatment: 3 Active Problems ICD-10 Encounter Code Description Active Date MDM Diagnosis I87.2 Venous insufficiency (chronic) (peripheral)  06/02/2021 No Yes L97.812 Non-pressure chronic ulcer of other part of right lower leg with fat layer 06/02/2021 No Yes exposed [place] (primary) hypertension 06/02/2021 No Yes Inactive Problems Resolved Problems Electronic Signature(s) Signed: 06/23/2021 4:05:24 PM By: Worthy Keeler PA-C Signed: 06/26/2021 8:01:43 AM By: Carlene Coria RN Previous Signature: 06/23/2021 10:19:11 AM Version By: Worthy Keeler PA-C Entered By: Carlene Coria on 06/23/2021 10:33:12 [name] ([id number]) -------------------------------------------------------------------------------- Progress Note Details Patient Name: Mario Baxter, Mario Baxter. Date of Service: 06/23/2021 10:15 AM Medical Record Number: [id number] Patient Account Number: [id number] Date of Birth/Sex: 1930-12-05 (88 y.o. M) Treating RN: Carlene Coria Primary Care Provider: Dorisann Frames Other Clinician: Referring Provider: Dorisann Frames Treating Provider/Extender: Skipper Cliche in Treatment: 3 Subjective Chief Complaint Information obtained from Patient Right LE Ulcer History of Present Illness (HPI) 06/02/2021 upon evaluation patient presents for initial inspection here in the clinic concerning issues that has been having with a wound on his right lower extremity. This is something that has been going on since around January 23. He is also been on antibiotics since the 30th that is doxycycline. With that being said no wound culture was taken prior to the initiation of antibiotic therapy. Currently the wound is still extremely red around the edges and very tender to touch as well. Fortunately I do not see any signs of active infection at this time. They have been soaking it with Epson salt for some time and using Neosporin now. Prior to that they were cleaning it with peroxide but it was not getting better so they switched away from that. 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 The patient  does have a history of chronic venous insufficiency, hypertension, but otherwise has really no major medical problems which is good news. 06/09/2021 upon evaluation today patient appears to be doing well with regard to his wound. He has been tolerating the dressing changes without complication. Fortunately I do not see any signs of active infection locally or systemically at this point. 2/17; patient admitted the clinic 2 weeks ago with a wound on the right anterior mid tibia of uncertain etiology. He does have chronic venous insufficiency and has had previous ablations on the right leg. He has had  courses of antibiotics. We have been using Promogran under 3 layer compression. 06/23/2021 upon evaluation today patient appears to be doing well with regard to his wound. In fact it appears to be completely healed based on what I am seeing. Objective Constitutional Well-nourished and well-hydrated in no acute distress. Vitals Time Taken: 10:11 AM, Height: 72 in, Weight: 215 lbs, BMI: 29.2, Temperature: 97.8 F, Pulse: 66 bpm, Respiratory Rate: 18 breaths/min, Blood Pressure: 157/78 mmHg. Respiratory normal breathing without difficulty. Psychiatric this patient is able to make decisions and demonstrates good insight into disease process. Alert and Oriented x 3. pleasant and cooperative. General Notes: Upon inspection patient's wound bed showed evidence of good epithelization at this point. He actually is completely closed and I think ready for discharge today. Integumentary (Hair, Skin) Wound #1 status is Open. Original cause of wound was Trauma. The date acquired was: 05/22/2021. The wound has been in treatment 3 weeks. The wound is located on the Right,Midline,Anterior Lower Leg. The wound measures 0cm length x 0cm width x 0cm depth; 0cm^2 area and 0cm^3 volume. There is no tunneling or undermining noted. There is a none present amount of drainage noted. The wound margin is flat and intact. There  is no granulation within the wound bed. There is no necrotic tissue within the wound bed. Mario Baxter, Mario Baxter ([id number]) Assessment Active Problems ICD-10 Venous insufficiency (chronic) (peripheral) Non-pressure chronic ulcer of other part of right lower leg with fat layer exposed Essential (primary) hypertension Plan Discharge From [facility] Services: Discharge from [place] Treatment Complete - apply protection times 1 week 1. Would recommend going to discontinue wound care services as the patient appears to be completely healed and he is in agreement with that plan. 2. Also can recommend that we have the patient continue to cover this for the next 2 weeks. I Minna recommend next care waterproof tape as a good option for him that will not damage the skin and will protect it quite nicely. We will see him back for follow-up visit as needed. Electronic Signature(s) Signed: 06/23/2021 10:34:17 AM By: Worthy Keeler PA-C Entered By: Worthy Keeler on 06/23/2021 10:34:17 [name] ([id number]) -------------------------------------------------------------------------------- SuperBill Details Patient Name: Mario Baxter, Mario Baxter. Date of Service: 06/23/2021 Medical Record Number: [id number] Patient Account Number: [id number] Date of Birth/Sex: [birth year] (88 y.o. M) Treating RN: Carlene Coria Primary Care Provider: Dorisann Frames Other Clinician: Referring Provider: Dorisann Frames Treating Provider/Extender: Skipper Cliche in Treatment: 3 Diagnosis Coding ICD-10 Codes Code Description I87.2 Venous insufficiency (chronic) (peripheral) L97.812 Non-pressure chronic ulcer of other part of right lower leg with fat layer exposed [place] (primary) hypertension Facility Procedures CPT4 Code: [id number] Description: [phone] - WOUND CARE VISIT-LEV 2 EST PT Modifier: Quantity: 1 Physician Procedures CPT4 Code: 7356701 Description: 41030 - WC PHYS LEVEL 3 - EST PT Modifier: Quantity:  1 CPT4 Code: Description: ICD-10 Diagnosis Description I87.2 Venous insufficiency (chronic) (peripheral) L97.812 Non-pressure chronic ulcer of other part of right lower leg with fat la I10 Essential (primary) hypertension Modifier: yer exposed Quantity: Electronic Signature(s) Signed: 06/23/2021 10:34:30 AM By: Worthy Keeler PA-C Entered By: Worthy Keeler on 06/23/2021 10:34:30

## 2021-06-26 NOTE — Progress Notes (Signed)
ELJAY, LAVE ([id number]) Visit Report for 06/23/2021 Arrival Information Details Patient Name: Mario Baxter, Mario Baxter. Date of Service: 06/23/2021 10:15 AM Medical Record Number: [id number] Patient Account Number: [id number] Date of Birth/Sex: 1931-03-07 (88 y.o. M) Treating RN: Carlene Coria Primary Care Kaylee Wombles: Dorisann Frames Other Clinician: Referring Manual Navarra: Dorisann Frames Treating Everest Hacking/Extender: Skipper Cliche in Treatment: 3 Visit Information History Since Last Visit All ordered tests and consults were completed: No Patient Arrived: Ambulatory Added or deleted any medications: No Arrival Time: 10:11 Any new allergies or adverse reactions: No Accompanied By: wife Had a fall or experienced change in No Transfer Assistance: None activities of daily living that may affect Patient Identification Verified: Yes risk of falls: Secondary Verification Process Completed: Yes Signs or symptoms of abuse/neglect since last visito No Patient Requires Transmission-Based No Hospitalized since last visit: No Precautions: Implantable device outside of the clinic excluding No Patient Has Alerts: Yes cellular tissue based products placed in the center Patient Alerts: Borderline DM diet since last visit: cont. Has Dressing in Place as Prescribed: Yes Has Compression in Place as Prescribed: Yes Pain Present Now: No Electronic Signature(s) Signed: 06/26/2021 8:01:43 AM By: Carlene Coria RN Entered By: Carlene Coria on 06/23/2021 10:11:27 [name] ([id number]) -------------------------------------------------------------------------------- Clinic Level of Care Assessment Details Patient Name: TEVYN, CODD. Date of Service: 06/23/2021 10:15 AM Medical Record Number: [id number] Patient Account Number: [id number] Date of Birth/Sex: [birth year] (88 y.o. M) Treating RN: Carlene Coria Primary Care Ranen Doolin: Dorisann Frames Other Clinician: Referring Nakeyia Menden: Dorisann Frames Treating Torrance Frech/Extender: Skipper Cliche in Treatment: 3 Clinic Level of Care Assessment Items TOOL 4 Quantity Score X - Use when only an EandM is performed on FOLLOW-UP visit 1 0 ASSESSMENTS - Nursing Assessment / Reassessment X - Reassessment of Co-morbidities (includes updates in patient status) 1 10 X- 1 5 Reassessment of Adherence to Treatment Plan ASSESSMENTS - Wound and Skin Assessment / Reassessment X - Simple Wound Assessment / Reassessment - one wound 1 5 []  - 0 Complex Wound Assessment / Reassessment - multiple wounds []  - 0 Dermatologic / Skin Assessment (not related to wound area) ASSESSMENTS - Focused Assessment []  - Circumferential Edema Measurements - multi extremities 0 []  - 0 Nutritional Assessment / Counseling / Intervention []  - 0 Lower Extremity Assessment (monofilament, tuning fork, pulses) []  - 0 Peripheral Arterial Disease Assessment (using hand held doppler) ASSESSMENTS - Ostomy and/or Continence Assessment and Care []  - Incontinence Assessment and Management 0 []  - 0 Ostomy Care Assessment and Management (repouching, etc.) PROCESS - Coordination of Care X - Simple Patient / Family Education for ongoing care 1 15 []  - 0 Complex (extensive) Patient / Family Education for ongoing care []  - 0 Staff obtains Programmer, systems, Records, Test Results / Process Orders []  - 0 Staff telephones HHA, Nursing Homes / Clarify orders / etc []  - 0 Routine Transfer to another Facility (non-emergent condition) []  - 0 Routine Hospital Admission (non-emergent condition) []  - 0 New Admissions / Biomedical engineer / Ordering NPWT, Apligraf, etc. []  - 0 Emergency Hospital Admission (emergent condition) X- 1 10 Simple Discharge Coordination []  - 0 Complex (extensive) Discharge Coordination PROCESS - Special Needs []  - Pediatric / Minor Patient Management 0 []  - 0 Isolation Patient Management []  - 0 Hearing / Language / Visual special needs []  -  0 Assessment of Community assistance (transportation, D/C planning, etc.) []  - 0 Additional assistance / Altered mentation []  - 0 Support Surface(s) Assessment (bed, cushion, seat, etc.) INTERVENTIONS - Wound Cleansing /  Measurement ARAFAT, COCUZZA ([id number]) X- 1 5 Simple Wound Cleansing - one wound []  - 0 Complex Wound Cleansing - multiple wounds X- 1 5 Wound Imaging (photographs - any number of wounds) []  - 0 Wound Tracing (instead of photographs) X- 1 5 Simple Wound Measurement - one wound []  - 0 Complex Wound Measurement - multiple wounds INTERVENTIONS - Wound Dressings []  - Small Wound Dressing one or multiple wounds 0 []  - 0 Medium Wound Dressing one or multiple wounds []  - 0 Large Wound Dressing one or multiple wounds []  - 0 Application of Medications - topical []  - 0 Application of Medications - injection INTERVENTIONS - Miscellaneous []  - External ear exam 0 []  - 0 Specimen Collection (cultures, biopsies, blood, body fluids, etc.) []  - 0 Specimen(s) / Culture(s) sent or taken to Lab for analysis []  - 0 Patient Transfer (multiple staff / Civil Service fast streamer / Similar devices) []  - 0 Simple Staple / Suture removal (25 or less) []  - 0 Complex Staple / Suture removal (26 or more) []  - 0 Hypo / Hyperglycemic Management (close monitor of Blood Glucose) []  - 0 Ankle / Brachial Index (ABI) - do not check if billed separately X- 1 5 Vital Signs Has the patient been seen at the hospital within the last three years: Yes Total Score: 65 Level Of Care: New/Established - Level 2 Electronic Signature(s) Signed: 06/26/2021 8:01:43 AM By: Carlene Coria RN Entered By: Carlene Coria on 06/23/2021 10:32:06 [name] ([id number]) -------------------------------------------------------------------------------- Encounter Discharge Information Details Patient Name: DANTE, ROUDEBUSH. Date of Service: 06/23/2021 10:15 AM Medical Record Number: [id number] Patient Account Number:  [id number] Date of Birth/Sex: Mar 05, 1931 (88 y.o. M) Treating RN: Carlene Coria Primary Care Iniya Matzek: Dorisann Frames Other Clinician: Referring Kendyl Festa: Dorisann Frames Treating Ridhi Hoffert/Extender: Skipper Cliche in Treatment: 3 Encounter Discharge Information Items Discharge Condition: Stable Ambulatory Status: Ambulatory Discharge Destination: Home Transportation: Private Auto Accompanied By: wife Schedule Follow-up Appointment: Yes Clinical Summary of Care: Patient Declined Electronic Signature(s) Signed: 06/26/2021 8:01:43 AM By: Carlene Coria RN Entered By: Carlene Coria on 06/23/2021 10:34:30 [name] ([id number]) -------------------------------------------------------------------------------- Lower Extremity Assessment Details Patient Name: BRAIAN, TIJERINA. Date of Service: 06/23/2021 10:15 AM Medical Record Number: [id number] Patient Account Number: [id number] Date of Birth/Sex: [birth year] (88 y.o. M) Treating RN: Carlene Coria Primary Care Dayven Linsley: Dorisann Frames Other Clinician: Referring Diona Peregoy: Dorisann Frames Treating Joyce Leckey/Extender: Skipper Cliche in Treatment: 3 Edema Assessment Assessed: [Left: No] [Right: No] [Left: Edema] [Right: :] Calf Left: Right: Point of Measurement: 34 cm From Medial Instep 36 cm Ankle Left: Right: Point of Measurement: 13 cm From Medial Instep 22 cm Vascular Assessment Pulses: Dorsalis Pedis Palpable: [Right:Yes] Electronic Signature(s) Signed: 06/26/2021 8:01:43 AM By: Carlene Coria RN Entered By: Carlene Coria on 06/23/2021 10:22:20 [name] ([id number]) -------------------------------------------------------------------------------- Multi Wound Chart Details Patient Name: [name]. Date of Service: 06/23/2021 10:15 AM Medical Record Number: [id number] Patient Account Number: [id number] Date of Birth/Sex: June 02, 1930 (88 y.o. M) Treating RN: Carlene Coria Primary Care Akshara Blumenthal: Dorisann Frames Other  Clinician: Referring Franchelle Foskett: Dorisann Frames Treating Tyrion Glaude/Extender: Skipper Cliche in Treatment: 3 Vital Signs Height(in): 72 Pulse(bpm): 48 Weight(lbs): 215 Blood Pressure(mmHg): 157/78 Body Mass Index(BMI): 29.2 Temperature(F): 97.8 Respiratory Rate(breaths/min): 18 Photos: [N/A:N/A] Wound Location: Right, Midline, Anterior Lower Leg N/A N/A Wounding Event: Trauma N/A N/A Primary Etiology: Venous Leg Ulcer N/A N/A Comorbid History: Cataracts, Glaucoma N/A N/A Date Acquired: 05/22/2021 N/A N/A Weeks of Treatment: 3 N/A N/A Wound Status: Open N/A N/A Wound  Recurrence: No N/A N/A Measurements L x W x D (cm) 0x0x0 N/A N/A Area (cm) : 0 N/A N/A Volume (cm) : 0 N/A N/A % Reduction in Area: 100.00% N/A N/A % Reduction in Volume: 100.00% N/A N/A Classification: Full Thickness Without Exposed N/A N/A Support Structures Exudate Amount: None Present N/A N/A Wound Margin: Flat and Intact N/A N/A Granulation Amount: None Present (0%) N/A N/A Necrotic Amount: None Present (0%) N/A N/A Exposed Structures: Fascia: No N/A N/A Fat Layer (Subcutaneous Tissue): No Tendon: No Muscle: No Joint: No Bone: No Epithelialization: Large (67-100%) N/A N/A Treatment Notes Electronic Signature(s) Signed: 06/26/2021 8:01:43 AM By: Carlene Coria RN Entered By: Carlene Coria on 06/23/2021 10:30:11 [name] ([id number]) -------------------------------------------------------------------------------- [place] Details Patient Name: HASSEN, BRUUN. Date of Service: 06/23/2021 10:15 AM Medical Record Number: [id number] Patient Account Number: [id number] Date of Birth/Sex: 04-Jan-1931 (88 y.o. M) Treating RN: Carlene Coria Primary Care Venisa Frampton: Dorisann Frames Other Clinician: Referring Emmalise Huard: Dorisann Frames Treating Emorie Mcfate/Extender: Skipper Cliche in Treatment: 3 Active Inactive Electronic Signature(s) Signed: 06/26/2021 8:01:43 AM By: Carlene Coria  RN Entered By: Carlene Coria on 06/23/2021 10:29:13 [name] ([id number]) -------------------------------------------------------------------------------- Pain Assessment Details Patient Name: GEORGIE, HAQUE. Date of Service: 06/23/2021 10:15 AM Medical Record Number: [id number] Patient Account Number: [id number] Date of Birth/Sex: 05-Feb-1931 (88 y.o. M) Treating RN: Carlene Coria Primary Care Shaira Sova: Dorisann Frames Other Clinician: Referring Trinitie Mcgirr: Dorisann Frames Treating Madinah Quarry/Extender: Skipper Cliche in Treatment: 3 Active Problems Location of Pain Severity and Description of Pain Patient Has Paino No Site Locations Pain Management and Medication Current Pain Management: Electronic Signature(s) Signed: 06/26/2021 8:01:43 AM By: Carlene Coria RN Entered By: Carlene Coria on 06/23/2021 10:11:51 [name] ([id number]) -------------------------------------------------------------------------------- Patient/Caregiver Education Details Patient Name: MARCELLA, DUNNAWAY. Date of Service: 06/23/2021 10:15 AM Medical Record Number: [id number] Patient Account Number: [id number] Date of Birth/Gender: 1930-12-31 (88 y.o. M) Treating RN: Carlene Coria Primary Care Physician: Dorisann Frames Other Clinician: Referring Physician: Dorisann Frames Treating Physician/Extender: Skipper Cliche in Treatment: 3 Education Assessment Education Provided To: Patient Education Topics Provided Wound/Skin Impairment: Methods: Explain/Verbal Responses: State content correctly Electronic Signature(s) Signed: 06/26/2021 8:01:43 AM By: Carlene Coria RN Entered By: Carlene Coria on 06/23/2021 10:32:48 GERRARD, CRYSTAL ([id number]) -------------------------------------------------------------------------------- Wound Assessment Details Patient Name: JAMERSON, VONBARGEN. Date of Service: 06/23/2021 10:15 AM Medical Record Number: [id number] Patient Account Number: [id number] Date of  Birth/Sex: 1931-01-08 (88 y.o. M) Treating RN: Carlene Coria Primary Care Latiesha Harada: Dorisann Frames Other Clinician: Referring Kedric Bumgarner: Dorisann Frames Treating Shaili Donalson/Extender: Skipper Cliche in Treatment: 3 Wound Status Wound Number: 1 Primary Etiology: Venous Leg Ulcer Wound Location: Right, Midline, Anterior Lower Leg Wound Status: Open Wounding Event: Trauma Comorbid History: Cataracts, Glaucoma Date Acquired: 05/22/2021 Weeks Of Treatment: 3 Clustered Wound: No Photos Wound Measurements Length: (cm) 0 Width: (cm) 0 Depth: (cm) 0 Area: (cm) 0 Volume: (cm) 0 % Reduction in Area: 100% % Reduction in Volume: 100% Epithelialization: Large (67-100%) Tunneling: No Undermining: No Wound Description Classification: Full Thickness Without Exposed Support Structure Wound Margin: Flat and Intact Exudate Amount: None Present s Foul Odor After Cleansing: No Slough/Fibrino No Wound Bed Granulation Amount: None Present (0%) Exposed Structure Necrotic Amount: None Present (0%) Fascia Exposed: No Fat Layer (Subcutaneous Tissue) Exposed: No Tendon Exposed: No Muscle Exposed: No Joint Exposed: No Bone Exposed: No Electronic Signature(s) Signed: 06/26/2021 8:01:43 AM By: Carlene Coria RN Entered By: Carlene Coria on 06/23/2021 10:21:21 [name] ([id number]) -------------------------------------------------------------------------------- Vitals Details  Patient Name: SEICHI, KAUFHOLD. Date of Service: 06/23/2021 10:15 AM Medical Record Number: [id number] Patient Account Number: [id number] Date of Birth/Sex: [birth year] (88 y.o. M) Treating RN: Carlene Coria Primary Care Honestii Marton: Dorisann Frames Other Clinician: Referring Cherylynn Liszewski: Dorisann Frames Treating Alexys Gassett/Extender: Skipper Cliche in Treatment: 3 Vital Signs Time Taken: 10:11 Temperature (F): 97.8 Height (in): 72 Pulse (bpm): 66 Weight (lbs): 215 Respiratory Rate (breaths/min): 18 Body Mass Index  (BMI): 29.2 Blood Pressure (mmHg): 157/78 Reference Range: 80 - 120 mg / dl Electronic Signature(s) Signed: 06/26/2021 8:01:43 AM By: Carlene Coria RN Entered By: Carlene Coria on 06/23/2021 10:11:44

## 2021-06-30 ENCOUNTER — Ambulatory Visit: Payer: Medicare Other | Admitting: Physician Assistant

## 2021-06-30 ENCOUNTER — Encounter: Payer: Medicare Other | Admitting: Physician Assistant

## 2021-07-07 ENCOUNTER — Encounter: Payer: Medicare Other | Admitting: Physician Assistant

## 2021-07-07 ENCOUNTER — Ambulatory Visit: Payer: Medicare Other | Admitting: Physician Assistant

## 2021-07-14 ENCOUNTER — Ambulatory Visit: Payer: Medicare Other | Admitting: Physician Assistant

## 2021-07-21 ENCOUNTER — Ambulatory Visit: Payer: Medicare Other | Admitting: Physician Assistant

## 2021-07-28 ENCOUNTER — Ambulatory Visit: Payer: Medicare Other | Admitting: Physician Assistant

## 2021-09-29 IMAGING — US US EXTREM LOW VENOUS*L*
1 series · 14 of 24 positions shown · non-contrast
Comparison: None.

CLINICAL DATA: Left leg swelling for the past 8 days.  Recent fall.

EXAM:
LEFT LOWER EXTREMITY VENOUS DOPPLER ULTRASOUND
TECHNIQUE: Gray-scale sonography with compression, as well as color and duplex
ultrasound, were performed to evaluate the deep venous system(s)
from the level of the common femoral vein through the popliteal and
proximal calf veins.

[Series 1: us extrem low venous*left* · 0.08mm/px · 14 of 41 slices shown]
[im 1/41]
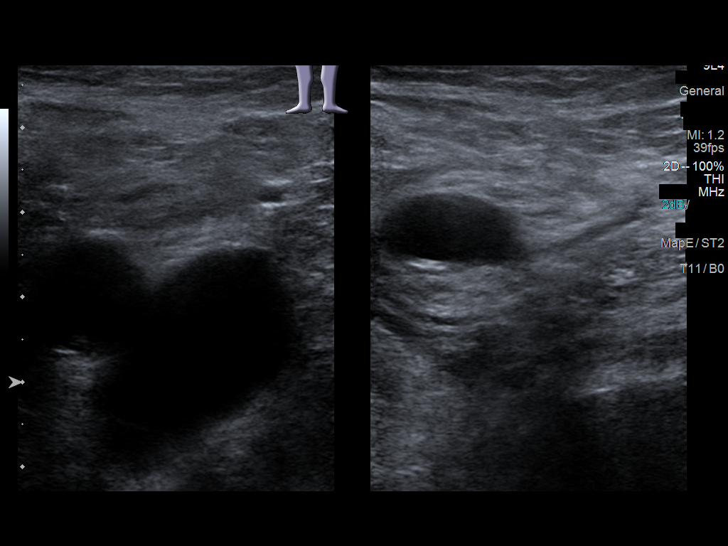
[im 4/41]
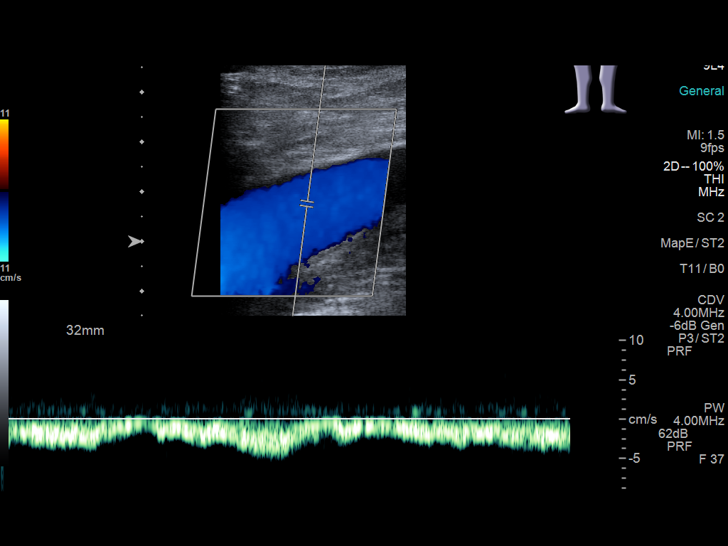
[im 7/41]
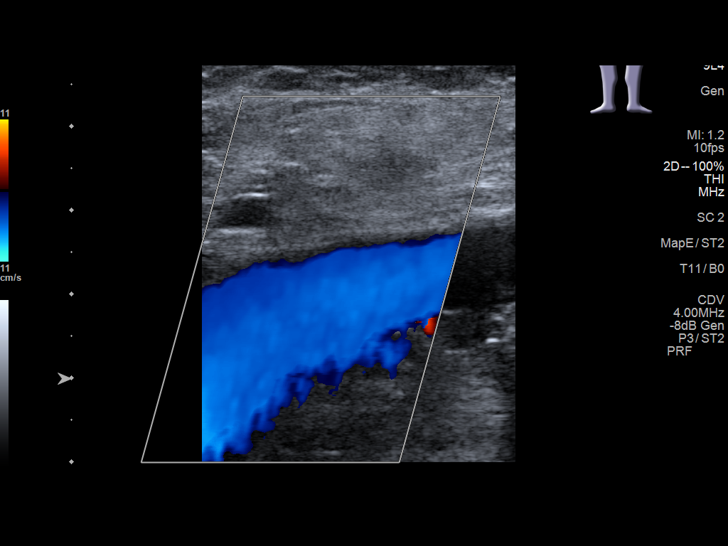
[im 11/41]
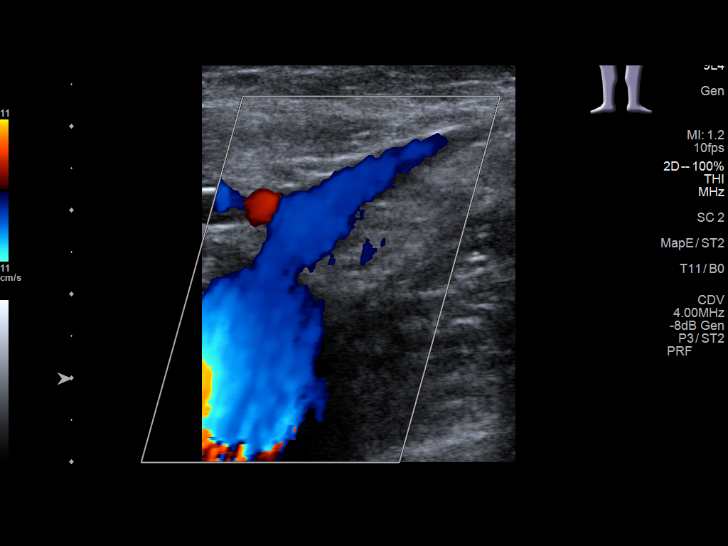
[im 13/41]
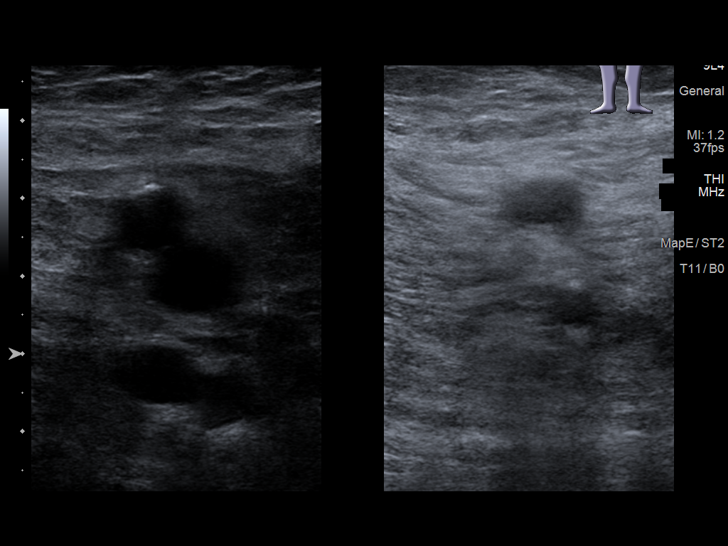
[im 16/41]
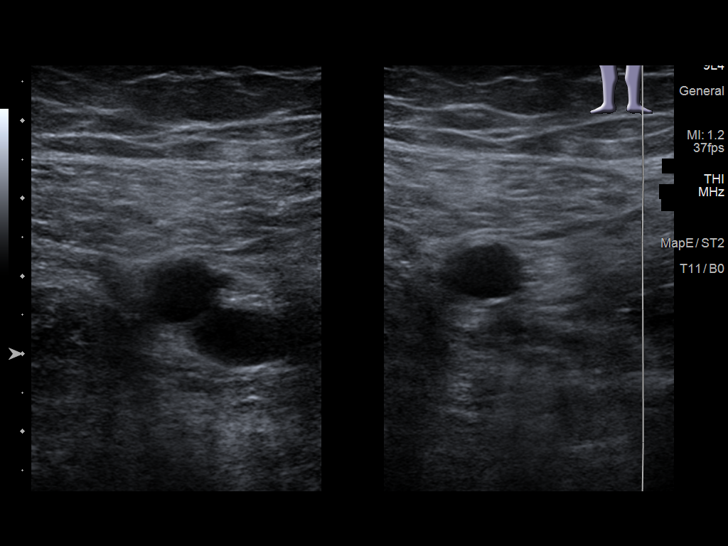
[im 20/41]
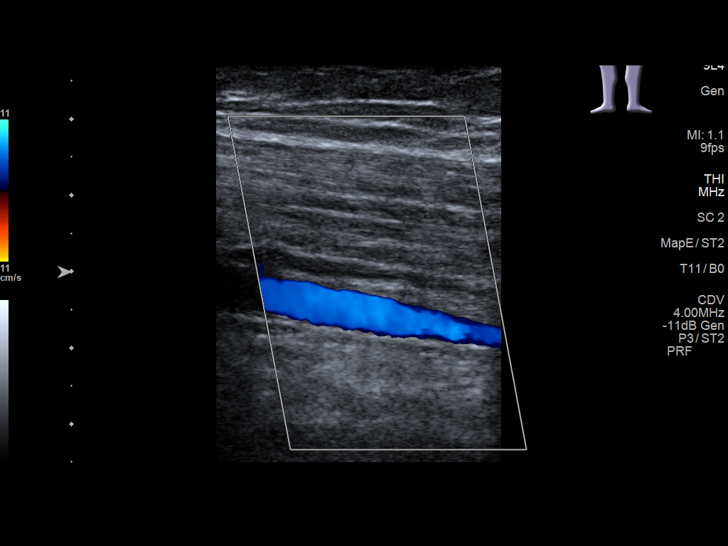
[im 21/41]
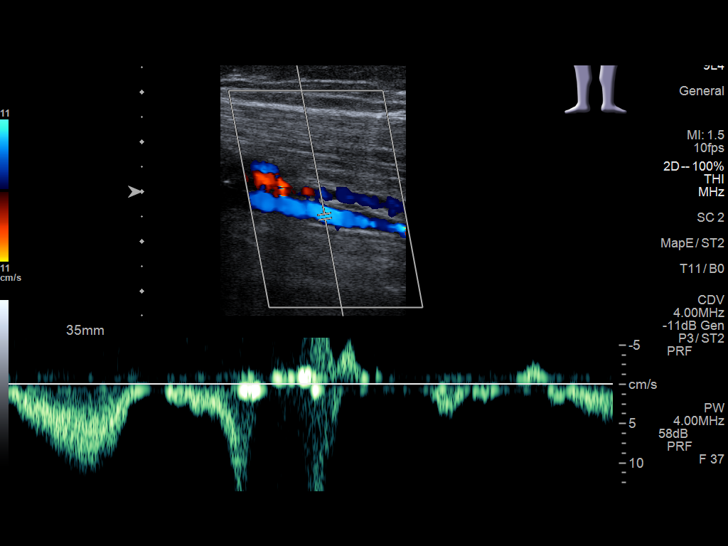
[im 25/41]
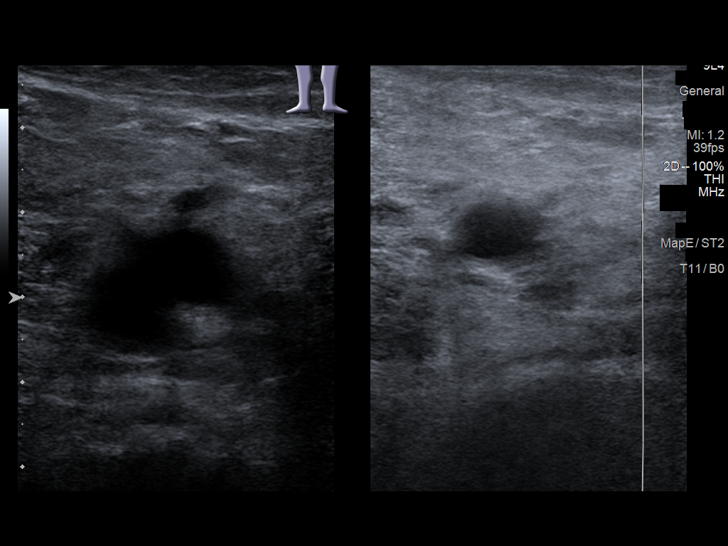
[im 28/41]
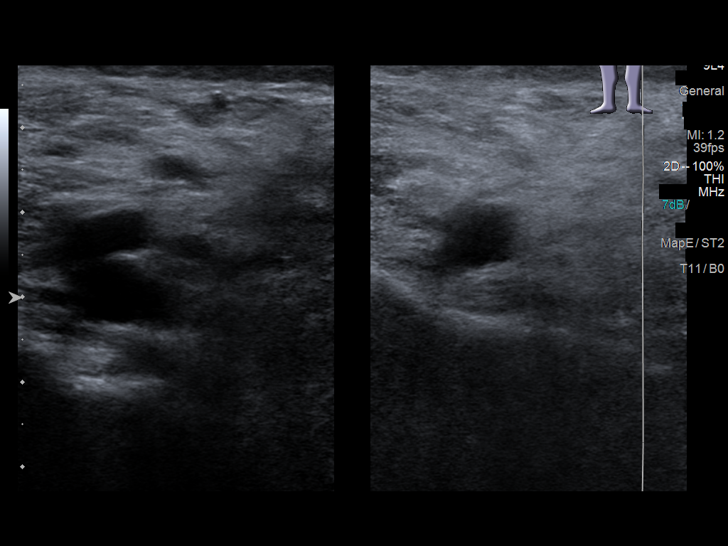
[im 32/41]
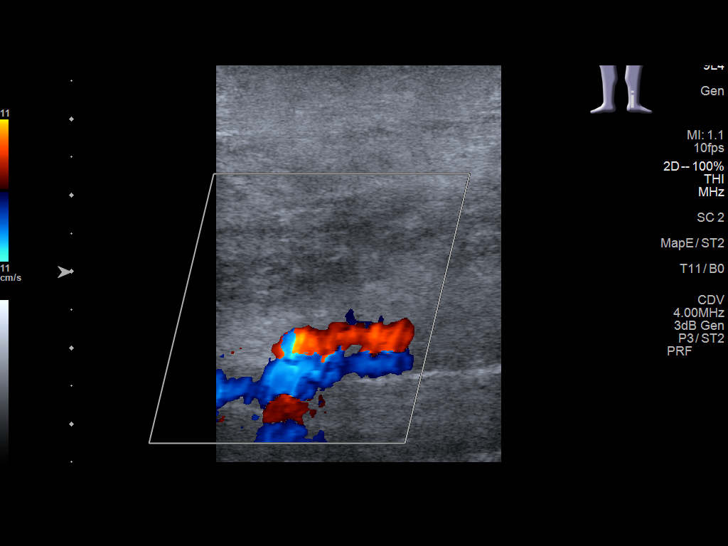
[im 34/41]
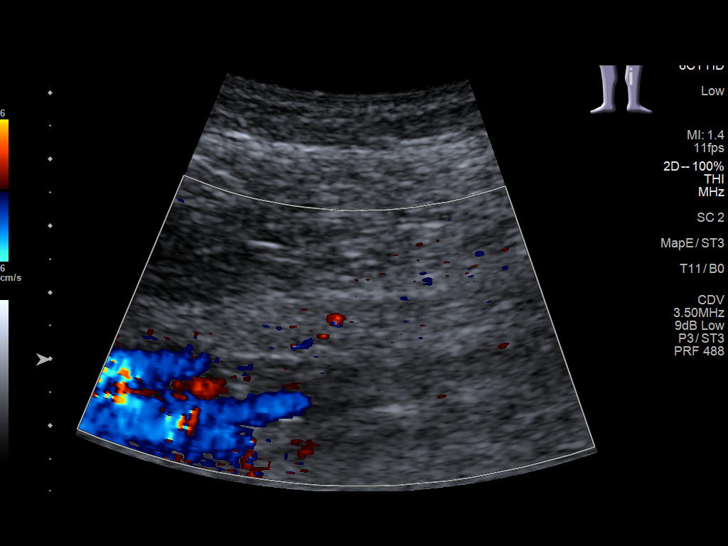
[im 37/41]
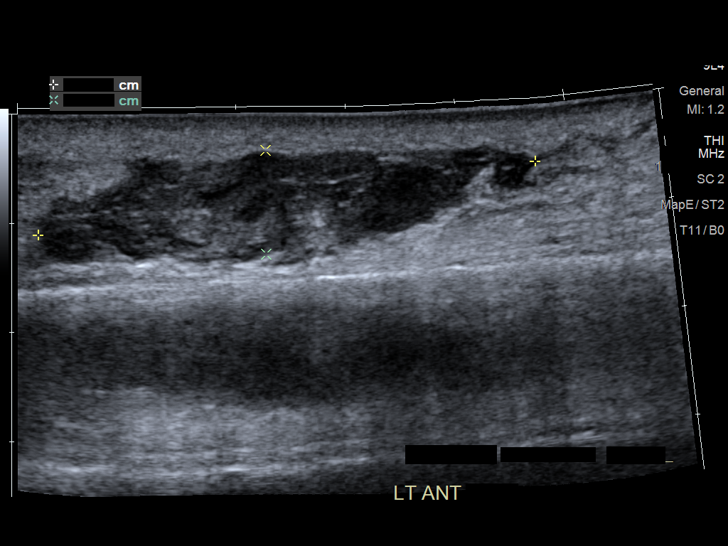
[im 41/41]
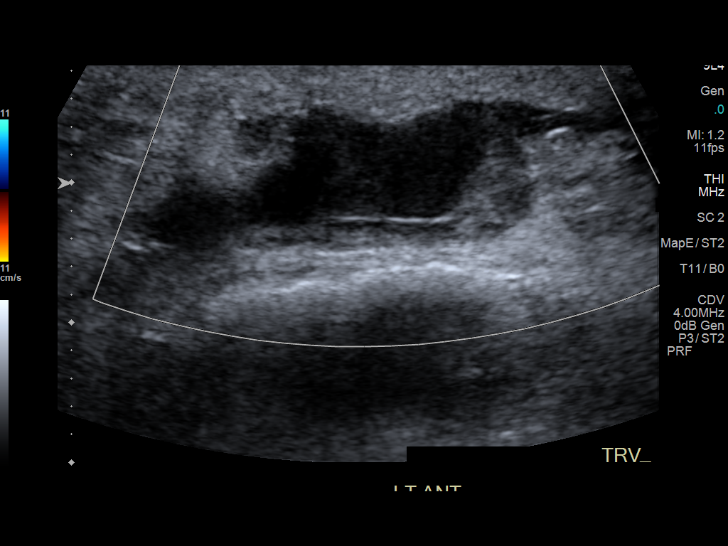

[14 of 24 positions shown; findings below may reference images not displayed]

FINDINGS: VENOUS

Normal compressibility of the common femoral, superficial femoral,
and popliteal veins, as well as the visualized calf veins.
Visualized portions of profunda femoral vein and great saphenous
vein unremarkable. No filling defects to suggest DVT on grayscale or
color Doppler imaging. Doppler waveforms show normal direction of
venous flow, normal respiratory plasticity and response to
augmentation.

Limited views of the contralateral common femoral vein are
unremarkable.

OTHER

Mixed echogenicity, predominantly hypoechoic 4.6 x 1.0 x 3.1 cm
superficial fluid collection in the anterior lower leg without
internal vascularity.

Limitations: None.
IMPRESSION: 1. No evidence of deep venous thrombosis.
2. 4.6 cm hematoma in the anterior lower leg.

## 2022-07-09 ENCOUNTER — Other Ambulatory Visit: Payer: Medicare Other

## 2022-07-09 ENCOUNTER — Other Ambulatory Visit: Payer: Medicare Other | Admitting: Internal Medicine

## 2022-07-09 ENCOUNTER — Other Ambulatory Visit: Payer: Self-pay | Admitting: Internal Medicine

## 2022-07-10 LAB — COMPREHENSIVE METABOLIC PANEL
ALT: 24 IU/L (ref 0–44)
AST: 27 IU/L (ref 0–40)
Albumin/Globulin Ratio: 2.5 — ABNORMAL HIGH (ref 1.2–2.2)
Albumin: 4.5 g/dL (ref 3.7–4.7)
Alkaline Phosphatase: 76 IU/L (ref 44–121)
BUN/Creatinine Ratio: 15 (ref 10–24)
BUN: 15 mg/dL (ref 8–27)
Bilirubin Total: 0.5 mg/dL (ref 0.0–1.2)
CO2: 23 mmol/L (ref 20–29)
Calcium: 9.4 mg/dL (ref 8.6–10.2)
Chloride: 107 mmol/L — ABNORMAL HIGH (ref 96–106)
Creatinine, Ser: 0.99 mg/dL (ref 0.76–1.27)
Globulin, Total: 1.8 g/dL (ref 1.5–4.5)
Glucose: 99 mg/dL (ref 70–99)
Potassium: 4.6 mmol/L (ref 3.5–5.2)
Sodium: 145 mmol/L — ABNORMAL HIGH (ref 134–144)
Total Protein: 6.3 g/dL (ref 6.0–8.5)
eGFR: 73 mL/min/{1.73_m2} (ref >59)

## 2022-07-10 LAB — LIPID PANEL W/O CHOL/HDL RATIO
Cholesterol, Total: 159 mg/dL (ref 100–199)
HDL: 70 mg/dL (ref >39)
LDL Chol Calc (NIH): 77 mg/dL (ref 0–99)
Triglycerides: 57 mg/dL (ref 0–149)
VLDL Cholesterol Cal: 12 mg/dL (ref 5–40)

## 2022-07-16 ENCOUNTER — Ambulatory Visit (INDEPENDENT_AMBULATORY_CARE_PROVIDER_SITE_OTHER): Payer: Medicare Other | Admitting: Internal Medicine

## 2022-07-16 VITALS — BP 112/62 | HR 68 | Temp 98.0°F | Ht 73.5 in | Wt 218.0 lb

## 2022-07-16 DIAGNOSIS — E782 Mixed hyperlipidemia: Secondary | ICD-10-CM | POA: Diagnosis not present

## 2022-07-16 DIAGNOSIS — K219 Gastro-esophageal reflux disease without esophagitis: Secondary | ICD-10-CM

## 2022-07-16 NOTE — Progress Notes (Signed)
Established Patient Office Visit  Subjective:  Patient ID: Mario Baxter, male    DOB: 1931/02/13  Age: 89 y.o. MRN: [id number]  Chief Complaint  Patient presents with  . Follow-up    3 months follow up with lab results    No new complaints, here for lab review and medication refills.Labs reviewed and notable for well controlled lipids. CMP notable for mild hypernatremia but normal bun and cr.  No other concerns at this time.   Past Medical History:  Diagnosis Date  . Cataract cortical, senile   . FH: colon polyps   . 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 Hyperlipidemia     Past Surgical History:  Procedure Laterality Date  . COLONOSCOPY WITH PROPOFOL N/A 09/11/2016   Procedure: COLONOSCOPY WITH PROPOFOL;  Surgeon: [name], MD;  Location: [facility] ENDOSCOPY;  Service: Endoscopy;  Laterality: N/A;  . ESOPHAGOGASTRODUODENOSCOPY (EGD) WITH PROPOFOL N/A 09/11/2016   Procedure: ESOPHAGOGASTRODUODENOSCOPY (EGD) WITH PROPOFOL;  Surgeon: [name], MD;  Location[facility] ENDOSCOPY;  Service: Endoscopy;  Laterality: N/A;  . EYE SURGERY Bilateral   . JOINT REPLACEMENT Right 02/08/2014    Social History   Socioeconomic History  . Marital status: Married    Spouse name: Not on file  . Number of children: Not on file  . Years of education: Not on file  . Highest education level: Not on file  Occupational History  . Not on file  Tobacco Use  . Smoking status: Never  . Smokeless tobacco: Never  Substance and Sexual Activity  . Alcohol use: Yes    Comment: WINE ONCE A DAY  . Drug use: No  . Sexual activity: Yes  Other Topics Concern  . Not on file  Social History Narrative  . Not on file   Social Determinants of Health   Financial Resource Strain: Not on file  Food Insecurity: Not on file  Transportation Needs: Not on file  Physical Activity: Not on file  Stress: Not on file  Social Connections: Not on file  Intimate Partner Violence: Not  on file    No family history on file.  No Known Allergies  Review of Systems  Constitutional: Negative.   HENT: Negative.    Eyes: Negative.   Respiratory: Negative.    Cardiovascular: Negative.   Gastrointestinal: Negative.   Genitourinary: Negative.   Skin:        Recently underwent multiple biopsies  Neurological: Negative.   Endo/Heme/Allergies: Negative.        Objective:   BP 112/62   Pulse 68   Temp 98 F (36.7 C) (Tympanic)   Ht 6' 1.5" (1.867 m)   Wt 218 lb (98.9 kg)   SpO2 97%   BMI 28.37 kg/m   Vitals:   07/16/22 1544  BP: 112/62  Pulse: 68  Temp: 98 F (36.7 C)  Height: 6' 1.5" (1.867 m)  Weight: 218 lb (98.9 kg)  SpO2: 97%  TempSrc: Tympanic  BMI (Calculated): 28.37    Physical Exam Vitals reviewed.  Constitutional:      Appearance: Normal appearance.  HENT:     Head: Normocephalic.     Left Ear: There is no impacted cerumen.     Nose: Nose normal.     Mouth/Throat:     Mouth: Mucous membranes are moist.     Pharynx: No posterior oropharyngeal erythema.  Eyes:     Extraocular Movements: Extraocular movements intact.     Pupils: Pupils are  equal, round, and reactive to light.  Cardiovascular:     Rate and Rhythm: Regular rhythm.     Chest Wall: PMI is not displaced.     Pulses: Normal pulses.     Heart sounds: Normal heart sounds. No murmur heard. Pulmonary:     Effort: Pulmonary effort is normal.     Breath sounds: Normal air entry. No rhonchi or rales.  Abdominal:     General: Abdomen is flat. Bowel sounds are normal. There is no distension.     Palpations: Abdomen is soft. There is no hepatomegaly, splenomegaly or mass.     Tenderness: There is no abdominal tenderness.  Musculoskeletal:        General: Normal range of motion.     Cervical back: Normal range of motion and neck supple.     Right lower leg: No edema.     Left lower leg: No edema.  Skin:    General: Skin is warm and dry.  Neurological:     General: No focal  deficit present.     Mental Status: He is alert and oriented to person, place, and time.     Cranial Nerves: No cranial nerve deficit.     Motor: No weakness.  Psychiatric:        Mood and Affect: Mood normal.        Behavior: Behavior normal.     No results found for any visits on 07/16/22.  Recent Results (from the past 2160 hour(Masaji Billups))  Comprehensive metabolic panel     Status: Abnormal   Collection Time: 07/09/22  8:46 AM  Result Value Ref Range   Glucose 99 70 - 99 mg/dL   BUN 15 8 - 27 mg/dL   Creatinine, Ser 0.99 0.76 - 1.27 mg/dL   eGFR 73 >59 mL/min/1.73   BUN/Creatinine Ratio 15 10 - 24   Sodium 145 (H) 134 - 144 mmol/L   Potassium 4.6 3.5 - 5.2 mmol/L   Chloride 107 (H) 96 - 106 mmol/L   CO2 23 20 - 29 mmol/L   Calcium 9.4 8.6 - 10.2 mg/dL   Total Protein 6.3 6.0 - 8.5 g/dL   Albumin 4.5 3.7 - 4.7 g/dL   Globulin, Total 1.8 1.5 - 4.5 g/dL   Albumin/Globulin Ratio 2.5 (H) 1.2 - 2.2   Bilirubin Total 0.5 0.0 - 1.2 mg/dL   Alkaline Phosphatase 76 44 - 121 IU/L   AST 27 0 - 40 IU/L   ALT 24 0 - 44 IU/L  Lipid Panel w/o Chol/HDL Ratio     Status: None   Collection Time: 07/09/22  8:46 AM  Result Value Ref Range   Cholesterol, Total 159 100 - 199 mg/dL   Triglycerides 57 0 - 149 mg/dL   HDL 70 >39 mg/dL   VLDL Cholesterol Cal 12 5 - 40 mg/dL   LDL Chol Calc (NIH) 77 0 - 99 mg/dL      Assessment & Plan:   Problem List Items Addressed This Visit   None   Return in about 3 months (around 10/16/2022) for awv with labs prior.   Total time spent: 20 minutes  Volanda Napoleon, MD  07/16/2022

## 2022-08-27 ENCOUNTER — Other Ambulatory Visit: Payer: Self-pay | Admitting: Internal Medicine

## 2022-08-27 MED ORDER — SIMVASTATIN 20 MG PO TABS
20.0000 mg | ORAL_TABLET | Freq: Every day | ORAL | 1 refills | Status: DC
Start: 2022-08-27 — End: 2022-09-04

## 2022-09-03 ENCOUNTER — Telehealth: Payer: Self-pay

## 2022-09-03 NOTE — Telephone Encounter (Signed)
Patient called stating that he should be be taking the 10 mg not the 20mg  that [facility]

## 2022-09-04 ENCOUNTER — Other Ambulatory Visit: Payer: Self-pay | Admitting: Internal Medicine

## 2022-09-04 ENCOUNTER — Telehealth: Payer: Self-pay | Admitting: Internal Medicine

## 2022-09-04 DIAGNOSIS — E782 Mixed hyperlipidemia: Secondary | ICD-10-CM

## 2022-09-04 MED ORDER — SIMVASTATIN 10 MG PO TABS: 10.0000 mg | ORAL_TABLET | Freq: Every evening | ORAL | 1 refills | Status: DC

## 2022-09-04 NOTE — Telephone Encounter (Signed)
Patient called again that [facility] filled simvastatin for 20 mg and he is supposed to be on 10 mg. Please send new Rx.

## 2022-09-10 ENCOUNTER — Other Ambulatory Visit: Payer: Self-pay | Admitting: Internal Medicine

## 2022-09-10 MED ORDER — RAMIPRIL 2.5 MG PO CAPS: 2.5000 mg | ORAL_CAPSULE | Freq: Every day | ORAL | 1 refills | Status: DC

## 2022-10-15 ENCOUNTER — Other Ambulatory Visit: Payer: Medicare Other

## 2022-10-15 DIAGNOSIS — K219 Gastro-esophageal reflux disease without esophagitis: Secondary | ICD-10-CM

## 2022-10-15 DIAGNOSIS — E782 Mixed hyperlipidemia: Secondary | ICD-10-CM

## 2022-10-16 LAB — LIPID PANEL
Chol/HDL Ratio: 2.6 ratio (ref 0.0–5.0)
Cholesterol, Total: 149 mg/dL (ref 100–199)
HDL: 57 mg/dL (ref >39)
LDL Chol Calc (NIH): 74 mg/dL (ref 0–99)
Triglycerides: 99 mg/dL (ref 0–149)
VLDL Cholesterol Cal: 18 mg/dL (ref 5–40)

## 2022-10-16 LAB — CBC WITH DIFF/PLATELET
Basophils Absolute: 0 10*3/uL (ref 0.0–0.2)
Basos: 1 %
EOS (ABSOLUTE): 0.2 10*3/uL (ref 0.0–0.4)
Eos: 3 %
Hematocrit: 38.4 % (ref 37.5–51.0)
Hemoglobin: 12.7 g/dL — ABNORMAL LOW (ref 13.0–17.7)
Immature Grans (Abs): 0 10*3/uL (ref 0.0–0.1)
Immature Granulocytes: 0 %
Lymphocytes Absolute: 1.3 10*3/uL (ref 0.7–3.1)
Lymphs: 30 %
MCH: 32.6 pg (ref 26.6–33.0)
MCHC: 33.1 g/dL (ref 31.5–35.7)
MCV: 99 fL — ABNORMAL HIGH (ref 79–97)
Monocytes Absolute: 0.4 10*3/uL (ref 0.1–0.9)
Monocytes: 9 %
Neutrophils Absolute: 2.5 10*3/uL (ref 1.4–7.0)
Neutrophils: 57 %
Platelets: 228 10*3/uL (ref 150–450)
RBC: 3.89 x10E6/uL — ABNORMAL LOW (ref 4.14–5.80)
RDW: 13.1 % (ref 11.6–15.4)
WBC: 4.4 10*3/uL (ref 3.4–10.8)

## 2022-10-16 LAB — COMPREHENSIVE METABOLIC PANEL
ALT: 20 IU/L (ref 0–44)
AST: 22 IU/L (ref 0–40)
Albumin: 4 g/dL (ref 3.7–4.7)
Alkaline Phosphatase: 78 IU/L (ref 44–121)
BUN/Creatinine Ratio: 11 (ref 10–24)
BUN: 11 mg/dL (ref 8–27)
Bilirubin Total: 0.5 mg/dL (ref 0.0–1.2)
CO2: 24 mmol/L (ref 20–29)
Calcium: 8.9 mg/dL (ref 8.6–10.2)
Chloride: 106 mmol/L (ref 96–106)
Creatinine, Ser: 1.02 mg/dL (ref 0.76–1.27)
Globulin, Total: 2 g/dL (ref 1.5–4.5)
Glucose: 112 mg/dL — ABNORMAL HIGH (ref 70–99)
Potassium: 4.6 mmol/L (ref 3.5–5.2)
Sodium: 142 mmol/L (ref 134–144)
Total Protein: 6 g/dL (ref 6.0–8.5)
eGFR: 70 mL/min/{1.73_m2} (ref >59)

## 2022-10-17 ENCOUNTER — Ambulatory Visit (INDEPENDENT_AMBULATORY_CARE_PROVIDER_SITE_OTHER): Payer: Medicare Other | Admitting: Internal Medicine

## 2022-10-17 VITALS — BP 129/62 | HR 74 | Ht 74.0 in | Wt 222.8 lb

## 2022-10-17 DIAGNOSIS — E782 Mixed hyperlipidemia: Secondary | ICD-10-CM

## 2022-10-17 DIAGNOSIS — K219 Gastro-esophageal reflux disease without esophagitis: Secondary | ICD-10-CM | POA: Diagnosis not present

## 2022-10-17 DIAGNOSIS — R7303 Prediabetes: Secondary | ICD-10-CM

## 2022-10-17 DIAGNOSIS — Z0001 Encounter for general adult medical examination with abnormal findings: Secondary | ICD-10-CM | POA: Diagnosis not present

## 2022-10-17 DIAGNOSIS — Z1331 Encounter for screening for depression: Secondary | ICD-10-CM

## 2022-10-17 DIAGNOSIS — Z723 Lack of physical exercise: Secondary | ICD-10-CM

## 2022-10-17 NOTE — Progress Notes (Signed)
Established Patient Office Visit  Subjective:  Patient ID: Mario Baxter, male    DOB: 02-Feb-1931  Age: 89 y.o. MRN: [id number]  No chief complaint on file.   No new complaints, here for AWV refer to quality metrics and scanned documents. Only worried about his balance while walking in [place]. Labs reviewed and notable for elevated fasting glucose, normalization of sodium and slightly low Hgb.    No other concerns at this time.   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 ENDOSCOPY;  Service: Endoscopy;  Laterality: N/A;   EYE SURGERY Bilateral    JOINT REPLACEMENT Right 02/08/2014    Social History   Socioeconomic History   Marital status: Married    Spouse name: Not on file   Number of children: Not on file   Years of education: Not on file   Highest education level: Not on file  Occupational History   Not on file  Tobacco Use   Smoking status: Never   Smokeless tobacco: Never  Substance and Sexual Activity   Alcohol use: Yes    Comment: WINE ONCE A DAY   Drug use: No   Sexual activity: Yes  Other Topics Concern   Not on file  Social History Narrative   Not on file   Social Determinants of Health   Financial Resource Strain: Not on file  Food Insecurity: Not on file  Transportation Needs: Not on file  Physical Activity: Not on file  Stress: Not on file  Social Connections: Not on file  Intimate Partner Violence: Not on file     No family history on file.  No Known Allergies  Review of Systems  Constitutional: Negative.   HENT: Negative.    Eyes: Negative.   Respiratory: Negative.    Cardiovascular: Negative.   Gastrointestinal: Negative.   Genitourinary: Negative.   Skin:        Recently underwent multiple biopsies  Neurological: Negative.   Endo/Heme/Allergies: Negative.        Objective:   BP 129/62   Pulse 74   Ht 6\' 2"  (1.88 m)   Wt 222 lb 12.8 oz (101.1 kg)   SpO2 97%   BMI 28.61 kg/m   Vitals:   10/17/22 0922  BP: 129/62  Pulse: 74  Height: 6\' 2"  (1.88 m)  Weight: 222 lb 12.8 oz (101.1 kg)  SpO2: 97%  BMI (Calculated): 28.59    Physical Exam Vitals reviewed.  Constitutional:      Appearance: Normal appearance.  HENT:     Head: Normocephalic.     Left Ear: There is no impacted cerumen.     Nose: Nose normal.     Mouth/Throat:     Mouth: Mucous membranes are moist.     Pharynx: No posterior oropharyngeal erythema.  Eyes:     Extraocular Movements: Extraocular movements intact.     Pupils: Pupils are equal, round, and reactive to light.  Cardiovascular:     Rate and Rhythm:  Regular rhythm.     Chest Wall: PMI is not displaced.     Pulses: Normal pulses.     Heart sounds: Normal heart sounds. No murmur heard. Pulmonary:     Effort: Pulmonary effort is normal.     Breath sounds: Normal air entry. No rhonchi or rales.  Abdominal:     General: Abdomen is flat. Bowel sounds are normal. There is no distension.     Palpations: Abdomen is soft. There is no hepatomegaly, splenomegaly or mass.     Tenderness: There is no abdominal tenderness.  Musculoskeletal:        General: Normal range of motion.     Cervical back: Normal range of motion and neck supple.     Right lower leg: No edema.     Left lower leg: No edema.  Skin:    General: Skin is warm and dry.  Neurological:     General: No focal deficit present.     Mental Status: He is alert and oriented to person,  place, and time.     Cranial Nerves: No cranial nerve deficit.     Motor: No weakness.  Psychiatric:        Mood and Affect: Mood normal.        Behavior: Behavior normal.      No results found for any visits on 10/17/22.  Recent Results (from the past 2160 hour(Stepen Prins))  Comprehensive metabolic panel     Status: Abnormal   Collection Time: 10/15/22  9:07 AM  Result Value Ref Range   Glucose 112 (H) 70 - 99 mg/dL   BUN 11 8 - 27 mg/dL   Creatinine, Ser 4.09 0.76 - 1.27 mg/dL   eGFR 70 >81 XB/JYN/8.29   BUN/Creatinine Ratio 11 10 - 24   Sodium 142 134 - 144 mmol/L   Potassium 4.6 3.5 - 5.2 mmol/L   Chloride 106 96 - 106 mmol/L   CO2 24 20 - 29 mmol/L   Calcium 8.9 8.6 - 10.2 mg/dL   Total Protein 6.0 6.0 - 8.5 g/dL   Albumin 4.0 3.7 - 4.7 g/dL   Globulin, Total 2.0 1.5 - 4.5 g/dL   Bilirubin Total 0.5 0.0 - 1.2 mg/dL   Alkaline Phosphatase 78 44 - 121 IU/L   AST 22 0 - 40 IU/L   ALT 20 0 - 44 IU/L  Lipid panel     Status: None   Collection Time: 10/15/22  9:07 AM  Result Value Ref Range   Cholesterol, Total 149 100 - 199 mg/dL   Triglycerides 99 0 - 149 mg/dL   HDL 57 >56 mg/dL   VLDL Cholesterol Cal 18 5 - 40 mg/dL   LDL Chol Calc (NIH) 74 0 - 99 mg/dL   Chol/HDL Ratio 2.6 0.0 - 5.0 ratio    Comment:                                   T. Chol/HDL Ratio                                             Men  Women                               1/2  Avg.Risk  3.4    3.3                                   Avg.Risk  5.0    4.4                                2X Avg.Risk  9.6    7.1                                3X Avg.Risk 23.4   11.0   CBC With Diff/Platelet     Status: Abnormal   Collection Time: 10/15/22  9:07 AM  Result Value Ref Range   WBC 4.4 3.4 - 10.8 x10E3/uL   RBC 3.89 (L) 4.14 - 5.80 x10E6/uL   Hemoglobin 12.7 (L) 13.0 - 17.7 g/dL   Hematocrit 62.9 52.8 - 51.0 %   MCV 99 (H) 79 - 97 fL   MCH 32.6 26.6 - 33.0 pg   MCHC 33.1 31.5 - 35.7 g/dL   RDW 41.3 24.4 - 01.0 %    Platelets 228 150 - 450 x10E3/uL   Neutrophils 57 Not Estab. %   Lymphs 30 Not Estab. %   Monocytes 9 Not Estab. %   Eos 3 Not Estab. %   Basos 1 Not Estab. %   Neutrophils Absolute 2.5 1.4 - 7.0 x10E3/uL   Lymphocytes Absolute 1.3 0.7 - 3.1 x10E3/uL   Monocytes Absolute 0.4 0.1 - 0.9 x10E3/uL   EOS (ABSOLUTE) 0.2 0.0 - 0.4 x10E3/uL   Basophils Absolute 0.0 0.0 - 0.2 x10E3/uL   Immature Granulocytes 0 Not Estab. %   Immature Grans (Abs) 0.0 0.0 - 0.1 x10E3/uL      Assessment & Plan:  As per problem list  Problem List Items Addressed This Visit   None Visit Diagnoses     Mixed hyperlipidemia    -  Primary   Relevant Orders   Lipid panel   Comprehensive metabolic panel   GERD without esophagitis       Prediabetes       Relevant Orders   Hemoglobin A1c       Return in about 3 months (around 01/17/2023) for fu with labs prior.   Total time spent: 35 minutes  Luna Fuse, MD  10/17/2022   This document may have been prepared by [facility] Voice Recognition software and as such may include unintentional dictation errors.

## 2023-01-18 ENCOUNTER — Other Ambulatory Visit: Payer: Medicare Other

## 2023-01-18 DIAGNOSIS — R7303 Prediabetes: Secondary | ICD-10-CM

## 2023-01-18 DIAGNOSIS — E782 Mixed hyperlipidemia: Secondary | ICD-10-CM

## 2023-01-19 LAB — COMPREHENSIVE METABOLIC PANEL
ALT: 18 IU/L (ref 0–44)
AST: 19 IU/L (ref 0–40)
Albumin: 4.3 g/dL (ref 3.6–4.6)
Alkaline Phosphatase: 78 IU/L (ref 44–121)
BUN/Creatinine Ratio: 17 (ref 10–24)
BUN: 18 mg/dL (ref 10–36)
Bilirubin Total: 0.4 mg/dL (ref 0.0–1.2)
CO2: 22 mmol/L (ref 20–29)
Calcium: 9.4 mg/dL (ref 8.6–10.2)
Chloride: 107 mmol/L — ABNORMAL HIGH (ref 96–106)
Creatinine, Ser: 1.08 mg/dL (ref 0.76–1.27)
Globulin, Total: 1.8 g/dL (ref 1.5–4.5)
Glucose: 95 mg/dL (ref 70–99)
Potassium: 4.7 mmol/L (ref 3.5–5.2)
Sodium: 145 mmol/L — ABNORMAL HIGH (ref 134–144)
Total Protein: 6.1 g/dL (ref 6.0–8.5)
eGFR: 65 mL/min/{1.73_m2} (ref >59)

## 2023-01-19 LAB — LIPID PANEL
Chol/HDL Ratio: 2.4 ratio (ref 0.0–5.0)
Cholesterol, Total: 149 mg/dL (ref 100–199)
HDL: 63 mg/dL (ref >39)
LDL Chol Calc (NIH): 75 mg/dL (ref 0–99)
Triglycerides: 49 mg/dL (ref 0–149)
VLDL Cholesterol Cal: 11 mg/dL (ref 5–40)

## 2023-01-19 LAB — HEMOGLOBIN A1C
Est. average glucose Bld gHb Est-mCnc: 117 mg/dL
Hgb A1c MFr Bld: 5.7 % — ABNORMAL HIGH (ref 4.8–5.6)

## 2023-01-21 ENCOUNTER — Ambulatory Visit (INDEPENDENT_AMBULATORY_CARE_PROVIDER_SITE_OTHER): Payer: Medicare Other | Admitting: Internal Medicine

## 2023-01-21 ENCOUNTER — Encounter: Payer: Self-pay | Admitting: Internal Medicine

## 2023-01-21 VITALS — BP 120/83 | HR 76 | Ht 73.0 in | Wt 216.3 lb

## 2023-01-21 DIAGNOSIS — R7303 Prediabetes: Secondary | ICD-10-CM | POA: Diagnosis not present

## 2023-01-21 DIAGNOSIS — E782 Mixed hyperlipidemia: Secondary | ICD-10-CM | POA: Diagnosis not present

## 2023-01-21 DIAGNOSIS — K219 Gastro-esophageal reflux disease without esophagitis: Secondary | ICD-10-CM | POA: Insufficient documentation

## 2023-01-21 NOTE — Progress Notes (Signed)
Established Patient Office Visit  Subjective:  Patient ID: Mario Baxter, male    DOB: 12/12/1930  Age: 89 y.o. MRN: [id number]  No chief complaint on file.   No new complaints, here for lab review and medication refills.  LDL and TC well controlled on lab review. Triglycerides also satisfactory. CMP notable for hypernatremia while A1c remains in the prediabetic range.  No other concerns at this time.   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, mild stage    Hyperlipidemia     Past Surgical History:  Procedure Laterality Date   COLONOSCOPY WITH PROPOFOL N/A 09/11/2016   Procedure: COLONOSCOPY WITH PROPOFOL;  Surgeon: Christena Deem, MD;  Location: [facility] ENDOSCOPY;  Service: Endoscopy;  Laterality: N/A;   ESOPHAGOGASTRODUODENOSCOPY (EGD) WITH PROPOFOL N/A 09/11/2016   Procedure: ESOPHAGOGASTRODUODENOSCOPY (EGD) WITH PROPOFOL;  Surgeon: Christena Deem, MD;  Location: [facility] ENDOSCOPY;  Service: Endoscopy;  Laterality: N/A;   EYE SURGERY Bilateral    JOINT REPLACEMENT Right 02/08/2014    Social History   Socioeconomic History   Marital status: Married    Spouse name: Not on file   Number of children: Not on file   Years of education: Not on file   Highest education level: Not on file  Occupational History   Not on file  Tobacco Use   Smoking status: Never   Smokeless tobacco: Never  Substance and Sexual Activity   Alcohol use: Yes    Comment: WINE ONCE A DAY   Drug use: No   Sexual activity: Yes  Other Topics Concern   Not on file  Social History Narrative   Not on file   Social Determinants of Health   Financial Resource Strain: Not on file  Food Insecurity: Not on file  Transportation Needs: Not on file  Physical Activity: Not on file  Stress: Not on file  Social Connections: Not on file  Intimate Partner Violence: Not on file    No family history on file.  No  Known Allergies  Review of Systems  Constitutional: Negative.   HENT: Negative.    Eyes: Negative.   Respiratory: Negative.    Cardiovascular: Negative.   Gastrointestinal: Negative.   Genitourinary: Negative.   Skin:        Recently underwent multiple biopsies  Neurological: Negative.   Endo/Heme/Allergies: Negative.        Objective:   BP 120/83 (BP Location: Left Arm)   Pulse 76   Ht 6\' 1"  (1.854 m)   Wt 216 lb 4.8 oz (98.1 kg)   SpO2 98%   BMI 28.54 kg/m   Vitals:   01/21/23 0937  BP: 120/83  Pulse: 76  Height: 6\' 1"  (1.854 m)  Weight: 216 lb 4.8 oz (98.1 kg)  SpO2: 98%  BMI (Calculated): 28.54    Physical Exam Vitals reviewed.  Constitutional:      Appearance: Normal appearance.  HENT:     Head: Normocephalic.     Left Ear: There is no impacted cerumen.     Nose: Nose normal.     Mouth/Throat:     Mouth: Mucous membranes are moist.     Pharynx: No posterior oropharyngeal erythema.  Eyes:     Extraocular Movements: Extraocular movements intact.     Pupils: Pupils are equal, round, and reactive to light.  Cardiovascular:     Rate and Rhythm: Regular rhythm.  Chest Wall: PMI is not displaced.     Pulses: Normal pulses.     Heart sounds: Normal heart sounds. No murmur heard. Pulmonary:     Effort: Pulmonary effort is normal.     Breath sounds: Normal air entry. No rhonchi or rales.  Abdominal:     General: Abdomen is flat. Bowel sounds are normal. There is no distension.     Palpations: Abdomen is soft. There is no hepatomegaly, splenomegaly or mass.     Tenderness: There is no abdominal tenderness.  Musculoskeletal:        General: Normal range of motion.     Cervical back: Normal range of motion and neck supple.     Right lower leg: No edema.     Left lower leg: No edema.  Skin:    General: Skin is warm and dry.  Neurological:     General: No focal deficit present.     Mental Status: He is alert and oriented to person, place, and time.      Cranial Nerves: No cranial nerve deficit.     Motor: No weakness.  Psychiatric:        Mood and Affect: Mood normal.        Behavior: Behavior normal.      No results found for any visits on 01/21/23.  Recent Results (from the past 2160 hour(Tayelor Osborne))  Comprehensive metabolic panel     Status: Abnormal   Collection Time: 01/18/23  8:39 AM  Result Value Ref Range   Glucose 95 70 - 99 mg/dL   BUN 18 10 - 36 mg/dL   Creatinine, Ser 2.95 0.76 - 1.27 mg/dL   eGFR 65 >18 AC/ZYS/0.63   BUN/Creatinine Ratio 17 10 - 24   Sodium 145 (H) 134 - 144 mmol/L   Potassium 4.7 3.5 - 5.2 mmol/L   Chloride 107 (H) 96 - 106 mmol/L   CO2 22 20 - 29 mmol/L   Calcium 9.4 8.6 - 10.2 mg/dL   Total Protein 6.1 6.0 - 8.5 g/dL   Albumin 4.3 3.6 - 4.6 g/dL   Globulin, Total 1.8 1.5 - 4.5 g/dL   Bilirubin Total 0.4 0.0 - 1.2 mg/dL   Alkaline Phosphatase 78 44 - 121 IU/L   AST 19 0 - 40 IU/L   ALT 18 0 - 44 IU/L  Lipid panel     Status: None   Collection Time: 01/18/23  8:39 AM  Result Value Ref Range   Cholesterol, Total 149 100 - 199 mg/dL   Triglycerides 49 0 - 149 mg/dL   HDL 63 >01 mg/dL   VLDL Cholesterol Cal 11 5 - 40 mg/dL   LDL Chol Calc (NIH) 75 0 - 99 mg/dL   Chol/HDL Ratio 2.4 0.0 - 5.0 ratio    Comment:                                   T. Chol/HDL Ratio                                             Men  Women                               1/2 Avg.Risk  3.4  3.3                                   Avg.Risk  5.0    4.4                                2X Avg.Risk  9.6    7.1                                3X Avg.Risk 23.4   11.0   Hemoglobin A1c     Status: Abnormal   Collection Time: 01/18/23  8:39 AM  Result Value Ref Range   Hgb A1c MFr Bld 5.7 (H) 4.8 - 5.6 %    Comment:          Prediabetes: 5.7 - 6.4          Diabetes: >6.4          Glycemic control for adults with diabetes: <7.0    Est. average glucose Bld gHb Est-mCnc 117 mg/dL      Assessment & Plan:  As per problem  list. Problem List Items Addressed This Visit       Digestive   GERD without esophagitis     Other   Prediabetes   Mixed hyperlipidemia - Primary   Relevant Orders   Comprehensive metabolic panel   Lipid panel    Return in about 3 months (around 04/22/2023) for fu with labs prior.   Total time spent: 20 minutes  Luna Fuse, MD  01/21/2023   This document may have been prepared by [facility] Voice Recognition software and as such may include unintentional dictation errors.

## 2023-04-01 ENCOUNTER — Other Ambulatory Visit: Payer: Self-pay | Admitting: Internal Medicine

## 2023-05-02 ENCOUNTER — Other Ambulatory Visit: Payer: Medicare Other

## 2023-05-02 DIAGNOSIS — E782 Mixed hyperlipidemia: Secondary | ICD-10-CM

## 2023-05-03 LAB — LIPID PANEL
Chol/HDL Ratio: 2.7 {ratio} (ref 0.0–5.0)
Cholesterol, Total: 168 mg/dL (ref 100–199)
HDL: 62 mg/dL (ref >39)
LDL Chol Calc (NIH): 91 mg/dL (ref 0–99)
Triglycerides: 77 mg/dL (ref 0–149)
VLDL Cholesterol Cal: 15 mg/dL (ref 5–40)

## 2023-05-03 LAB — COMPREHENSIVE METABOLIC PANEL
ALT: 20 [IU]/L (ref 0–44)
AST: 20 [IU]/L (ref 0–40)
Albumin: 4.2 g/dL (ref 3.6–4.6)
Alkaline Phosphatase: 77 [IU]/L (ref 44–121)
BUN/Creatinine Ratio: 16 (ref 10–24)
BUN: 22 mg/dL (ref 10–36)
Bilirubin Total: 0.4 mg/dL (ref 0.0–1.2)
CO2: 24 mmol/L (ref 20–29)
Calcium: 9.5 mg/dL (ref 8.6–10.2)
Chloride: 107 mmol/L — ABNORMAL HIGH (ref 96–106)
Creatinine, Ser: 1.38 mg/dL — ABNORMAL HIGH (ref 0.76–1.27)
Globulin, Total: 2 g/dL (ref 1.5–4.5)
Glucose: 107 mg/dL — ABNORMAL HIGH (ref 70–99)
Potassium: 4.4 mmol/L (ref 3.5–5.2)
Sodium: 145 mmol/L — ABNORMAL HIGH (ref 134–144)
Total Protein: 6.2 g/dL (ref 6.0–8.5)
eGFR: 49 mL/min/{1.73_m2} — ABNORMAL LOW (ref >59)

## 2023-05-06 ENCOUNTER — Encounter: Payer: Self-pay | Admitting: Internal Medicine

## 2023-05-06 ENCOUNTER — Ambulatory Visit (INDEPENDENT_AMBULATORY_CARE_PROVIDER_SITE_OTHER): Payer: Medicare Other | Admitting: Internal Medicine

## 2023-05-06 VITALS — BP 132/89 | HR 64 | Ht 73.0 in | Wt 213.4 lb

## 2023-05-06 DIAGNOSIS — N179 Acute kidney failure, unspecified: Secondary | ICD-10-CM

## 2023-05-06 DIAGNOSIS — E782 Mixed hyperlipidemia: Secondary | ICD-10-CM | POA: Diagnosis not present

## 2023-05-06 DIAGNOSIS — K219 Gastro-esophageal reflux disease without esophagitis: Secondary | ICD-10-CM

## 2023-05-06 DIAGNOSIS — E870 Hyperosmolality and hypernatremia: Secondary | ICD-10-CM

## 2023-05-06 DIAGNOSIS — Z0130 Encounter for examination of blood pressure without abnormal findings: Secondary | ICD-10-CM

## 2023-05-06 LAB — POCT URINALYSIS DIPSTICK
Bilirubin, UA: NEGATIVE
Blood, UA: NEGATIVE
Glucose, UA: NEGATIVE
Ketones, UA: NEGATIVE
Leukocytes, UA: NEGATIVE
Nitrite, UA: NEGATIVE
Protein, UA: NEGATIVE
Spec Grav, UA: 1.015 (ref 1.010–1.025)
Urobilinogen, UA: 0.2 U/dL
pH, UA: 6 (ref 5.0–8.0)

## 2023-05-06 MED ORDER — OMEPRAZOLE 40 MG PO CPDR
40.0000 mg | DELAYED_RELEASE_CAPSULE | Freq: Every morning | ORAL | 1 refills | Status: DC
Start: 2023-05-06 — End: 2023-11-12

## 2023-05-06 MED ORDER — SIMVASTATIN 10 MG PO TABS: 10.0000 mg | ORAL_TABLET | Freq: Every evening | ORAL | 1 refills | Status: DC

## 2023-05-06 NOTE — Progress Notes (Signed)
 Established Patient Office Visit  Subjective:  Patient ID: Mario Baxter, male    DOB: December 10, 1930  Age: 89 y.o. MRN: [id number]  Chief Complaint  Patient presents with   Follow-up    3 month follow up     No new complaints, here for lab review and medication refills. Labs reviewed and notable for hypernatremia and acute deterioration in renal function. Admits to high salt consumption with ham and still poor fluid intake only 500 ml of water  per day.   No other concerns at this time.   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 ENDOSCOPY;  Service: Endoscopy;  Laterality: N/A;   EYE SURGERY Bilateral    JOINT REPLACEMENT Right 02/08/2014    Social History   Socioeconomic History   Marital status: Married    Spouse name: Not on file   Number of children: Not on file   Years of education: Not on file   Highest education level: Not on file  Occupational History   Not on file  Tobacco Use   Smoking status: Never   Smokeless tobacco: Never  Substance and Sexual Activity   Alcohol use: Yes    Comment: WINE ONCE A DAY   Drug use: No   Sexual activity: Yes  Other Topics Concern   Not on file  Social History Narrative   Not on file   Social Drivers of Health   Financial Resource Strain: Not on file  Food Insecurity: Not on file  Transportation Needs: Not on file  Physical Activity: Not on file  Stress: Not on file  Social Connections: Not on file   Intimate Partner Violence: Not on file    No family history on file.  No Known Allergies  Outpatient Medications Prior to Visit  Medication Sig   doxycycline (VIBRA-TABS) 100 MG tablet Take 100 mg by mouth 2 (two) times daily. (Patient not taking: Reported on 07/16/2022)   Multiple Vitamins-Minerals (CENTRUM SILVER 50+MEN) TABS Take 1 tablet by mouth daily.   omeprazole  (PRILOSEC) 40 MG capsule TAKE 1 CAPSULE BY MOUTH EVERY MORNING   ramipril  (ALTACE ) 2.5 MG capsule TAKE 1 CAPSULE BY MOUTH EVERY MORNING   sildenafil (VIAGRA) 100 MG tablet Take 100 mg by mouth as needed for erectile dysfunction.   simvastatin  (ZOCOR ) 10 MG tablet Take 1 tablet (10 mg total) by mouth every evening.   No facility-administered medications prior to visit.    Review of Systems  Constitutional: Negative.   HENT: Negative.    Eyes: Negative.   Respiratory: Negative.    Cardiovascular: Negative.   Gastrointestinal: Negative.   Genitourinary: Negative.   Skin:        Recently underwent multiple biopsies  Neurological: Negative.   Endo/Heme/Allergies: Negative.        Objective:   BP 132/89   Pulse 64   Ht 6' 1 (1.854 m)   Wt 213 lb 6.4 oz (96.8 kg)   SpO2 98%   BMI 28.15 kg/m  Vitals:   05/06/23 0942  BP: 132/89  Pulse: 64  Height: 6' 1 (1.854 m)  Weight: 213 lb 6.4 oz (96.8 kg)  SpO2: 98%  BMI (Calculated): 28.16    Physical Exam Vitals reviewed.  Constitutional:      Appearance: Normal appearance.  HENT:     Head: Normocephalic.     Left Ear: There is no impacted cerumen.     Nose: Nose normal.     Mouth/Throat:     Mouth: Mucous membranes are moist.     Pharynx: No posterior oropharyngeal erythema.  Eyes:     Extraocular Movements: Extraocular movements intact.     Pupils: Pupils are equal, round, and reactive to light.  Cardiovascular:     Rate and Rhythm: Regular rhythm.     Chest Wall: PMI is not displaced.     Pulses: Normal pulses.     Heart sounds: Normal  heart sounds. No murmur heard. Pulmonary:     Effort: Pulmonary effort is normal.     Breath sounds: Normal air entry. No rhonchi or rales.  Abdominal:     General: Abdomen is flat. Bowel sounds are normal. There is no distension.     Palpations: Abdomen is soft. There is no hepatomegaly, splenomegaly or mass.     Tenderness: There is no abdominal tenderness.  Musculoskeletal:        General: Normal range of motion.     Cervical back: Normal range of motion and neck supple.     Right lower leg: No edema.     Left lower leg: No edema.  Skin:    General: Skin is warm and dry.  Neurological:     General: No focal deficit present.     Mental Status: He is alert and oriented to person, place, and time.     Cranial Nerves: No cranial nerve deficit.     Motor: No weakness.  Psychiatric:        Mood and Affect: Mood normal.        Behavior: Behavior normal.      No results found for any visits on 05/06/23.  Recent Results (from the past 2160 hours)  Lipid panel     Status: None   Collection Time: 05/02/23  8:17 AM  Result Value Ref Range   Cholesterol, Total 168 100 - 199 mg/dL   Triglycerides 77 0 - 149 mg/dL   HDL 62 >60 mg/dL   VLDL Cholesterol Cal 15 5 - 40 mg/dL   LDL Chol Calc (NIH) 91 0 - 99 mg/dL   Chol/HDL Ratio 2.7 0.0 - 5.0 ratio    Comment:                                   T. Chol/HDL Ratio                                             Men  Women                               1/2 Avg.Risk  3.4    3.3  Avg.Risk  5.0    4.4                                2X Avg.Risk  9.6    7.1                                3X Avg.Risk 23.4   11.0   Comprehensive metabolic panel     Status: Abnormal   Collection Time: 05/02/23  8:17 AM  Result Value Ref Range   Glucose 107 (H) 70 - 99 mg/dL   BUN 22 10 - 36 mg/dL   Creatinine, Ser 8.61 (H) 0.76 - 1.27 mg/dL   eGFR 49 (L) >40 fO/fpw/8.26   BUN/Creatinine Ratio 16 10 - 24   Sodium 145 (H) 134 -  144 mmol/L   Potassium 4.4 3.5 - 5.2 mmol/L   Chloride 107 (H) 96 - 106 mmol/L   CO2 24 20 - 29 mmol/L   Calcium 9.5 8.6 - 10.2 mg/dL   Total Protein 6.2 6.0 - 8.5 g/dL   Albumin 4.2 3.6 - 4.6 g/dL   Globulin, Total 2.0 1.5 - 4.5 g/dL   Bilirubin Total 0.4 0.0 - 1.2 mg/dL   Alkaline Phosphatase 77 44 - 121 IU/L   AST 20 0 - 40 IU/L   ALT 20 0 - 44 IU/L      Assessment & Plan:  As per problem list. Increase fluid intake and provided with education. Problem List Items Addressed This Visit       Other   Mixed hyperlipidemia - Primary   Relevant Medications   sildenafil (VIAGRA) 100 MG tablet   Other Relevant Orders   BMP8+Anion Gap   Other Visit Diagnoses       Acute kidney injury (HCC)         Hypernatremia           Return in about 3 weeks (around 05/27/2023) for fu with labs prior.   Total time spent: 30 minutes  Sherrill Cinderella Perry, MD  05/06/2023   This document may have been prepared by [facility] Voice Recognition software and as such may include unintentional dictation errors.

## 2023-05-24 ENCOUNTER — Other Ambulatory Visit: Payer: Medicare Other

## 2023-05-25 LAB — BMP8+ANION GAP
Anion Gap: 17 mmol/L (ref 10.0–18.0)
BUN/Creatinine Ratio: 14 (ref 10–24)
BUN: 20 mg/dL (ref 10–36)
CO2: 23 mmol/L (ref 20–29)
Calcium: 9.4 mg/dL (ref 8.6–10.2)
Chloride: 104 mmol/L (ref 96–106)
Creatinine, Ser: 1.42 mg/dL — ABNORMAL HIGH (ref 0.76–1.27)
Glucose: 110 mg/dL — ABNORMAL HIGH (ref 70–99)
Potassium: 4.2 mmol/L (ref 3.5–5.2)
Sodium: 144 mmol/L (ref 134–144)
eGFR: 47 mL/min/{1.73_m2} — ABNORMAL LOW (ref >59)

## 2023-05-27 ENCOUNTER — Ambulatory Visit: Payer: TRICARE For Life (TFL) | Admitting: Internal Medicine

## 2023-05-28 ENCOUNTER — Encounter: Payer: Self-pay | Admitting: Internal Medicine

## 2023-05-28 ENCOUNTER — Ambulatory Visit (INDEPENDENT_AMBULATORY_CARE_PROVIDER_SITE_OTHER): Payer: Medicare Other | Admitting: Internal Medicine

## 2023-05-28 VITALS — BP 123/72 | HR 60 | Temp 98.3°F | Ht 73.0 in | Wt 214.0 lb

## 2023-05-28 DIAGNOSIS — E870 Hyperosmolality and hypernatremia: Secondary | ICD-10-CM

## 2023-05-28 DIAGNOSIS — Z0130 Encounter for examination of blood pressure without abnormal findings: Secondary | ICD-10-CM

## 2023-05-28 DIAGNOSIS — E782 Mixed hyperlipidemia: Secondary | ICD-10-CM

## 2023-05-28 DIAGNOSIS — N1831 Chronic kidney disease, stage 3a: Secondary | ICD-10-CM | POA: Diagnosis not present

## 2023-05-28 DIAGNOSIS — S8990XA Unspecified injury of unspecified lower leg, initial encounter: Secondary | ICD-10-CM

## 2023-05-28 MED ORDER — ACE KNEE BRACE LARGE MISC: 1.0000 | Freq: Every day | 0 refills | Status: AC

## 2023-05-28 NOTE — Progress Notes (Signed)
Established Patient Office Visit  Subjective:  Patient ID: Mario Baxter, male    DOB: [birth year]  Age: 89 y.o. MRN: [id number]  Chief Complaint  Patient presents with   Follow-up    3 week follow up    C/o pain in his left popliteal fossa while walking. Improved with rest. BMP reviewed and notable for improved sodium with stable renal function.    No other concerns at this time.   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, mild stage    Hyperlipidemia     Past Surgical History:  Procedure Laterality Date   COLONOSCOPY WITH PROPOFOL N/A 09/11/2016   Procedure: COLONOSCOPY WITH PROPOFOL;  Surgeon: Christena Deem, MD;  Location: [facility] ENDOSCOPY;  Service: Endoscopy;  Laterality: N/A;   ESOPHAGOGASTRODUODENOSCOPY (EGD) WITH PROPOFOL N/A 09/11/2016   Procedure: ESOPHAGOGASTRODUODENOSCOPY (EGD) WITH PROPOFOL;  Surgeon: Christena Deem, MD;  Location: [facility] ENDOSCOPY;  Service: Endoscopy;  Laterality: N/A;   EYE SURGERY Bilateral    JOINT REPLACEMENT Right 02/08/2014    Social History   Socioeconomic History   Marital status: Married    Spouse name: Not on file   Number of children: Not on file   Years of education: Not on file   Highest education level: Not on file  Occupational History   Not on file  Tobacco Use   Smoking status: Never   Smokeless tobacco: Never  Substance and Sexual Activity   Alcohol use: Yes    Comment: WINE ONCE A DAY   Drug use: No   Sexual activity: Yes  Other Topics Concern   Not on file  Social History Narrative   Not on file   Social Drivers of Health   Financial Resource Strain: Not on file  Food Insecurity: Not on file  Transportation Needs: Not on file  Physical Activity: Not on file  Stress: Not on file  Social Connections: Not on file  Intimate Partner Violence: Not on file    No family history on file.  No Known  Allergies  Outpatient Medications Prior to Visit  Medication Sig   Multiple Vitamins-Minerals (CENTRUM SILVER 50+MEN) TABS Take 1 tablet by mouth daily.   omeprazole (PRILOSEC) 40 MG capsule Take 1 capsule (40 mg total) by mouth every morning.   ramipril (ALTACE) 2.5 MG capsule TAKE 1 CAPSULE BY MOUTH EVERY MORNING   sildenafil (VIAGRA) 100 MG tablet Take 100 mg by mouth as needed for erectile dysfunction.   simvastatin (ZOCOR) 10 MG tablet Take 1 tablet (10 mg total) by mouth every evening.   doxycycline (VIBRA-TABS) 100 MG tablet Take 100 mg by mouth 2 (two) times daily. (Patient not taking: Reported on 05/28/2023)   No facility-administered medications prior to visit.    Review of Systems  Constitutional: Negative.   HENT: Negative.    Eyes: Negative.   Respiratory: Negative.    Cardiovascular: Negative.   Gastrointestinal: Negative.   Genitourinary: Negative.   Skin:        Recently underwent multiple biopsies  Neurological: Negative.   Endo/Heme/Allergies: Negative.        Objective:   BP 123/72   Pulse 60   Temp 98.3 F (36.8 C)   Ht 6\' 1"  (1.854 m)   Wt 214 lb (97.1 kg)   SpO2 98%   BMI 28.23 kg/m   Vitals:   05/28/23 1027  BP: 123/72  Pulse: 60  Temp: 98.3 F (36.8 C)  Height: 6\' 1"  (1.854 m)  Weight: 214 lb (97.1 kg)  SpO2: 98%  BMI (Calculated): 28.24    Physical Exam Vitals reviewed.  Constitutional:      Appearance: Normal appearance.  HENT:     Head: Normocephalic.     Left Ear: There is no impacted cerumen.     Nose: Nose normal.     Mouth/Throat:     Mouth: Mucous membranes are moist.     Pharynx: No posterior oropharyngeal erythema.  Eyes:     Extraocular Movements: Extraocular movements intact.     Pupils: Pupils are equal, round, and reactive to light.  Cardiovascular:     Rate and Rhythm: Regular rhythm.     Chest Wall: PMI is not displaced.     Pulses: Normal pulses.     Heart sounds: Normal heart sounds. No murmur  heard. Pulmonary:     Effort: Pulmonary effort is normal.     Breath sounds: Normal air entry. No rhonchi or rales.  Abdominal:     General: Abdomen is flat. Bowel sounds are normal. There is no distension.     Palpations: Abdomen is soft. There is no hepatomegaly, splenomegaly or mass.     Tenderness: There is no abdominal tenderness.  Musculoskeletal:        General: Normal range of motion.     Cervical back: Normal range of motion and neck supple.     Right lower leg: No edema.     Left lower leg: Normal. No swelling. No edema.  Skin:    General: Skin is warm and dry.  Neurological:     General: No focal deficit present.     Mental Status: He is alert and oriented to person, place, and time.     Cranial Nerves: No cranial nerve deficit.     Motor: No weakness.  Psychiatric:        Mood and Affect: Mood normal.        Behavior: Behavior normal.      No results found for any visits on 05/28/23.  Recent Results (from the past 2160 hours)  Lipid panel     Status: None   Collection Time: 05/02/23  8:17 AM  Result Value Ref Range   Cholesterol, Total 168 100 - 199 mg/dL   Triglycerides 77 0 - 149 mg/dL   HDL 62 >16 mg/dL   VLDL Cholesterol Cal 15 5 - 40 mg/dL   LDL Chol Calc (NIH) 91 0 - 99 mg/dL   Chol/HDL Ratio 2.7 0.0 - 5.0 ratio    Comment:                                   T. Chol/HDL Ratio                                             Men  Women                               1/2 Avg.Risk  3.4    3.3  Avg.Risk  5.0    4.4                                2X Avg.Risk  9.6    7.1                                3X Avg.Risk 23.4   11.0   Comprehensive metabolic panel     Status: Abnormal   Collection Time: 05/02/23  8:17 AM  Result Value Ref Range   Glucose 107 (H) 70 - 99 mg/dL   BUN 22 10 - 36 mg/dL   Creatinine, Ser 1.61 (H) 0.76 - 1.27 mg/dL   eGFR 49 (L) >09 UE/AVW/0.98   BUN/Creatinine Ratio 16 10 - 24   Sodium 145 (H) 134 - 144  mmol/L   Potassium 4.4 3.5 - 5.2 mmol/L   Chloride 107 (H) 96 - 106 mmol/L   CO2 24 20 - 29 mmol/L   Calcium 9.5 8.6 - 10.2 mg/dL   Total Protein 6.2 6.0 - 8.5 g/dL   Albumin 4.2 3.6 - 4.6 g/dL   Globulin, Total 2.0 1.5 - 4.5 g/dL   Bilirubin Total 0.4 0.0 - 1.2 mg/dL   Alkaline Phosphatase 77 44 - 121 IU/L   AST 20 0 - 40 IU/L   ALT 20 0 - 44 IU/L  POCT Urinalysis Dipstick (11914)     Status: Normal   Collection Time: 05/06/23 10:48 AM  Result Value Ref Range   Color, UA     Clarity, UA     Glucose, UA Negative Negative   Bilirubin, UA Negative    Ketones, UA Negative    Spec Grav, UA 1.015 1.010 - 1.025   Blood, UA Negative    pH, UA 6.0 5.0 - 8.0   Protein, UA Negative Negative   Urobilinogen, UA 0.2 0.2 or 1.0 E.U./dL   Nitrite, UA Negative    Leukocytes, UA Negative Negative   Appearance     Odor    BMP8+Anion Gap     Status: Abnormal   Collection Time: 05/24/23  8:11 AM  Result Value Ref Range   Glucose 110 (H) 70 - 99 mg/dL   BUN 20 10 - 36 mg/dL   Creatinine, Ser 7.82 (H) 0.76 - 1.27 mg/dL   eGFR 47 (L) >95 AO/ZHY/8.65   BUN/Creatinine Ratio 14 10 - 24   Sodium 144 134 - 144 mmol/L   Potassium 4.2 3.5 - 5.2 mmol/L   Chloride 104 96 - 106 mmol/L   CO2 23 20 - 29 mmol/L   Anion Gap 17.0 10.0 - 18.0 mmol/L   Calcium 9.4 8.6 - 10.2 mg/dL      Assessment & Plan:  As per problem list, continue to drink at least 2L fluids/day. Problem List Items Addressed This Visit       Genitourinary   CKD stage 3a, GFR 45-59 ml/min (HCC) - Primary   Relevant Orders   CK     Other   Mixed hyperlipidemia   Hypernatremia   Other Visit Diagnoses       Injury of calf       Relevant Medications   Elastic Bandages & Supports (ACE KNEE BRACE LARGE) MISC       Return in about 11 weeks (around 08/13/2023) for fu with labs prior.   Total time spent: 20 minutes  Luna Fuse, MD  05/28/2023   This document may have been prepared by [facility] Voice  Recognition software and as such may include unintentional dictation errors.

## 2023-07-25 ENCOUNTER — Other Ambulatory Visit: Payer: Self-pay

## 2023-07-25 ENCOUNTER — Emergency Department (HOSPITAL_BASED_OUTPATIENT_CLINIC_OR_DEPARTMENT_OTHER)
Admission: EM | Admit: 2023-07-25 | Discharge: 2023-07-25 | Disposition: A | Discharge status: Home / Self Care | Attending: Emergency Medicine | Admitting: Emergency Medicine

## 2023-07-25 ENCOUNTER — Encounter (HOSPITAL_BASED_OUTPATIENT_CLINIC_OR_DEPARTMENT_OTHER): Payer: Self-pay | Admitting: Emergency Medicine

## 2023-07-25 ENCOUNTER — Emergency Department (HOSPITAL_BASED_OUTPATIENT_CLINIC_OR_DEPARTMENT_OTHER): Admitting: Radiology

## 2023-07-25 ENCOUNTER — Other Ambulatory Visit (HOSPITAL_COMMUNITY): Payer: Self-pay

## 2023-07-25 ENCOUNTER — Other Ambulatory Visit (HOSPITAL_BASED_OUTPATIENT_CLINIC_OR_DEPARTMENT_OTHER): Payer: Self-pay

## 2023-07-25 DIAGNOSIS — U071 COVID-19: Secondary | ICD-10-CM | POA: Diagnosis not present

## 2023-07-25 DIAGNOSIS — I10 Essential (primary) hypertension: Secondary | ICD-10-CM | POA: Insufficient documentation

## 2023-07-25 DIAGNOSIS — R059 Cough, unspecified: Secondary | ICD-10-CM | POA: Diagnosis present

## 2023-07-25 HISTORY — DX: Gastro-esophageal reflux disease without esophagitis: K21.9

## 2023-07-25 HISTORY — DX: Essential (primary) hypertension: I10

## 2023-07-25 LAB — RESP PANEL BY RT-PCR (RSV, FLU A&B, COVID)  RVPGX2
Influenza A by PCR: NEGATIVE
Influenza B by PCR: NEGATIVE
Resp Syncytial Virus by PCR: NEGATIVE
SARS Coronavirus 2 by RT PCR: POSITIVE — AB

## 2023-07-25 MED ORDER — MOLNUPIRAVIR EUA 200MG CAPSULE: 4.0000 | ORAL_CAPSULE | Freq: Two times a day (BID) | ORAL | 0 refills | Status: AC

## 2023-07-25 MED ORDER — MOLNUPIRAVIR EUA 200MG CAPSULE
4.0000 | ORAL_CAPSULE | Freq: Two times a day (BID) | ORAL | 0 refills | Status: AC
  Filled 2023-07-25 (×4): qty 40, 5d supply, fill #0

## 2023-07-25 NOTE — ED Notes (Signed)
 Discharge instruction given by provider. Patient left prior to having getting AVS or discharge VS taken.

## 2023-07-25 NOTE — Discharge Instructions (Signed)
 Your workup today showed you have COVID infection.  Chest x-ray did not show any concerning findings.  I have prescribed you molnupiravir.  We did walk you today and you did not have any drop in your oxygen saturations.  Please follow-up with your primary care doctor to ensure that you are recovering well.  If in the meantime you have any worsening symptoms please return to the emergency room.

## 2023-07-25 NOTE — ED Notes (Signed)
 LMVM for pt re: scripts and insurance for pharmacy.

## 2023-07-25 NOTE — ED Notes (Signed)
 RT ambulated the patient on RA, oxygen SAT 96% and HR 68

## 2023-07-25 NOTE — ED Triage Notes (Signed)
 Pt c/o cough,  runny nose with congestion x 5 days. Low energy, decreased po intake. Also c/o sore throat

## 2023-07-25 NOTE — ED Provider Notes (Signed)
 [facility] EMERGENCY DEPARTMENT AT [facility] Provider Note   CSN: [id number] Arrival date & time: 07/25/23  5176     History  Chief Complaint  Patient presents with   Cough    Mario Baxter is a 89 y.o. male.  89 year old male presents with his wife for concern of congestion, postnasal drainage, cough that has been ongoing for the past 5 days.  He does endorse decreased energy and appetite over the same timeframe.  Denies fever, shortness of breath, chest pain.  He states he was unable to follow-up with his primary care doctor so he came in to the emergency department to be seen.  States he is starting to develop pharyngitis which prompted him to come in today.  The history is provided by the patient. No language interpreter was used.       Home Medications Prior to Admission medications   Medication Sig Start Date End Date Taking? Authorizing Provider  doxycycline (VIBRA-TABS) 100 MG tablet Take 100 mg by mouth 2 (two) times daily. Patient not taking: Reported on 05/28/2023    [provider]  Multiple Vitamins-Minerals (CENTRUM SILVER 50+MEN) TABS Take 1 tablet by mouth daily.    [provider]  omeprazole (PRILOSEC) 40 MG capsule Take 1 capsule (40 mg total) by mouth every morning. 05/06/23   Sherron Monday, MD  ramipril (ALTACE) 2.5 MG capsule TAKE 1 CAPSULE BY MOUTH EVERY MORNING 04/01/23   Sherron Monday, MD  sildenafil (VIAGRA) 100 MG tablet Take 100 mg by mouth as needed for erectile dysfunction.    [provider]  simvastatin (ZOCOR) 10 MG tablet Take 1 tablet (10 mg total) by mouth every evening. 05/06/23 11/02/23  Sherron Monday, MD      Allergies    Patient has no known allergies.    Review of Systems   Review of Systems  Constitutional:  Negative for chills and fever.  HENT:  Positive for congestion, postnasal drip and sore throat. Negative for trouble swallowing and voice change.   Respiratory:  Positive for cough.  Negative for shortness of breath.   Cardiovascular:  Negative for chest pain.  Neurological:  Negative for light-headedness.  All other systems reviewed and are negative.   Physical Exam Updated Vital Signs BP (!) 158/85 (BP Location: Right Arm)   Pulse 69   Temp (!) 97.5 F (36.4 C)   Resp 18   Wt 92.1 kg   SpO2 96%   BMI 26.78 kg/m  Physical Exam Vitals and nursing note reviewed.  Constitutional:      General: He is not in acute distress.    Appearance: Normal appearance. He is not ill-appearing.  HENT:     Head: Normocephalic and atraumatic.     Nose: Nose normal.  Eyes:     General: No scleral icterus.    Extraocular Movements: Extraocular movements intact.     Conjunctiva/sclera: Conjunctivae normal.  Cardiovascular:     Rate and Rhythm: Normal rate and regular rhythm.     Heart sounds: Normal heart sounds.  Pulmonary:     Effort: Pulmonary effort is normal. No respiratory distress.     Breath sounds: Normal breath sounds. No wheezing or rales.  Abdominal:     General: There is no distension.     Palpations: Abdomen is soft.     Tenderness: There is no abdominal tenderness. There is no guarding.  Musculoskeletal:        General: Normal range of motion.  Cervical back: Normal range of motion.  Skin:    General: Skin is warm and dry.  Neurological:     General: No focal deficit present.     Mental Status: He is alert. Mental status is at baseline.     ED Results / Procedures / Treatments   Labs (all labs ordered are listed, but only abnormal results are displayed) Labs Reviewed  RESP PANEL BY RT-PCR (RSV, FLU A&B, COVID)  RVPGX2    EKG None  Radiology No results found.  Procedures Procedures    Medications Ordered in ED Medications - No data to display  ED Course/ Medical Decision Making/ A&P                                 Medical Decision Making Amount and/or Complexity of Data Reviewed Radiology: ordered.   Medical Decision  Making / ED Course   This patient presents to the ED for concern of cough, congestion, this involves an extensive number of treatment options, and is a complaint that carries with it a high risk of complications and morbidity.  The differential diagnosis includes viral URI, sinusitis, pneumonia  MDM: 89 year old male presents today for concern of cough, congestion ongoing for the past 5 days.  Denies any known sick contacts.  He states he started to develop some sore throat which prompted him to come in today to get checked out.  Admission considered but will reevaluate after chest x-ray, respiratory panel.  Patient is otherwise stable.  Normal O2 sats on room air.  Respiratory panel positive for COVID.  Chest x-ray without acute cardiopulmonary process.  Patient ambulated within the room by respiratory therapist.  Maintain O2 sats of at least 96% on room air. Patient otherwise feels well.  He is stable for discharge.  Advised him to follow-up with his PCP in the next few days for reevaluation.  Strict return precautions given.  Patient voices understanding and is in agreement with plan. Case also discussed with attending who is in agreement with plan.   Additional history obtained: -Additional history obtained from wife at bedside -External records from outside source obtained and reviewed including: Chart review including previous notes, labs, imaging, consultation notes   Lab Tests: -I ordered, reviewed, and interpreted labs.   The pertinent results include:   Labs Reviewed  RESP PANEL BY RT-PCR (RSV, FLU A&B, COVID)  RVPGX2      EKG  EKG Interpretation Date/Time:    Ventricular Rate:    PR Interval:    QRS Duration:    QT Interval:    QTC Calculation:   R Axis:      Text Interpretation:           Imaging Studies ordered: I ordered imaging studies including cxr I independently visualized and interpreted imaging. I agree with the radiologist  interpretation   Medicines ordered and prescription drug management: No orders of the defined types were placed in this encounter.   -I have reviewed the patients home medicines and have made adjustments as needed     Reevaluation: After the interventions noted above, I reevaluated the patient and found that they have :stayed the same.  Remained without acute distress or concern throughout the ED stay.  Ambulated without difficulty.  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  Return precaution discussed.  Patient voices understanding and is in agreement with plan.   Final Clinical Impression(s) / ED Diagnoses Final diagnoses:  COVID-19    Rx / DC Orders ED Discharge Orders          Ordered    molnupiravir EUA (LAGEVRIO) 200 mg CAPS capsule  2 times daily,   Status:  Discontinued        07/25/23 1145    molnupiravir EUA (LAGEVRIO) 200 mg CAPS capsule  2 times daily        07/25/23 1158              Marita [place], PA-C 07/25/23 1209    Edwin Dada P, DO 07/25/23 1529

## 2023-07-29 ENCOUNTER — Other Ambulatory Visit (HOSPITAL_COMMUNITY): Payer: Self-pay

## 2023-08-15 ENCOUNTER — Other Ambulatory Visit

## 2023-08-15 ENCOUNTER — Other Ambulatory Visit: Payer: Self-pay | Admitting: Internal Medicine

## 2023-08-16 LAB — LIPID PANEL
Chol/HDL Ratio: 3.4 ratio (ref 0.0–5.0)
Cholesterol, Total: 140 mg/dL (ref 100–199)
HDL: 41 mg/dL (ref >39)
LDL Chol Calc (NIH): 81 mg/dL (ref 0–99)
Triglycerides: 93 mg/dL (ref 0–149)
VLDL Cholesterol Cal: 18 mg/dL (ref 5–40)

## 2023-08-16 LAB — COMPREHENSIVE METABOLIC PANEL WITH GFR
ALT: 11 IU/L (ref 0–44)
AST: 15 IU/L (ref 0–40)
Albumin: 4.2 g/dL (ref 3.6–4.6)
Alkaline Phosphatase: 90 IU/L (ref 44–121)
BUN/Creatinine Ratio: 9 — ABNORMAL LOW (ref 10–24)
BUN: 32 mg/dL (ref 10–36)
Bilirubin Total: 0.3 mg/dL (ref 0.0–1.2)
CO2: 20 mmol/L (ref 20–29)
Calcium: 9.1 mg/dL (ref 8.6–10.2)
Chloride: 107 mmol/L — ABNORMAL HIGH (ref 96–106)
Creatinine, Ser: 3.44 mg/dL — ABNORMAL HIGH (ref 0.76–1.27)
Globulin, Total: 2.3 g/dL (ref 1.5–4.5)
Glucose: 100 mg/dL — ABNORMAL HIGH (ref 70–99)
Potassium: 4.6 mmol/L (ref 3.5–5.2)
Sodium: 144 mmol/L (ref 134–144)
Total Protein: 6.5 g/dL (ref 6.0–8.5)
eGFR: 16 mL/min/{1.73_m2} — ABNORMAL LOW (ref >59)

## 2023-08-17 ENCOUNTER — Other Ambulatory Visit (HOSPITAL_COMMUNITY): Payer: Self-pay

## 2023-08-19 ENCOUNTER — Ambulatory Visit (INDEPENDENT_AMBULATORY_CARE_PROVIDER_SITE_OTHER): Payer: TRICARE For Life (TFL) | Admitting: Internal Medicine

## 2023-08-19 ENCOUNTER — Encounter: Payer: Self-pay | Admitting: Internal Medicine

## 2023-08-19 VITALS — BP 130/80 | HR 66 | Temp 97.6°F | Ht 73.0 in | Wt 215.8 lb

## 2023-08-19 DIAGNOSIS — E782 Mixed hyperlipidemia: Secondary | ICD-10-CM

## 2023-08-19 DIAGNOSIS — N184 Chronic kidney disease, stage 4 (severe): Secondary | ICD-10-CM | POA: Insufficient documentation

## 2023-08-19 DIAGNOSIS — Z0130 Encounter for examination of blood pressure without abnormal findings: Secondary | ICD-10-CM

## 2023-08-19 DIAGNOSIS — N1831 Chronic kidney disease, stage 3a: Secondary | ICD-10-CM

## 2023-08-19 LAB — POCT URINALYSIS DIPSTICK
Bilirubin, UA: NEGATIVE
Blood, UA: 10
Glucose, UA: NEGATIVE
Ketones, UA: NEGATIVE
Leukocytes, UA: NEGATIVE
Nitrite, UA: NEGATIVE
Protein, UA: NEGATIVE
Spec Grav, UA: 1.01 (ref 1.010–1.025)
Urobilinogen, UA: 0.2 U/dL
pH, UA: 6 (ref 5.0–8.0)

## 2023-08-19 LAB — POC CREATINE & ALBUMIN,URINE
Albumin/Creatinine Ratio, Urine, POC: <30
Creatinine, POC: 10 mg/dL
Microalbumin Ur, POC: 10 mg/L

## 2023-08-19 MED ORDER — SIMVASTATIN 10 MG PO TABS: 10.0000 mg | ORAL_TABLET | Freq: Every evening | ORAL | 1 refills | Status: DC

## 2023-08-19 NOTE — Progress Notes (Signed)
 Established Patient Office Visit  Subjective:  Patient ID: Mario Baxter, male    DOB: [birth year]  Age: 89 y.o. MRN: [id number]  Chief Complaint  Patient presents with   Follow-up    11 week lab results    No new complaints, here for lab review and medication refills. Recently recovered from COVID19. Labs reviewed and notable for marked deterioration in Cr and GFR.   No other concerns at this time.   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, mild stage    Hyperlipidemia    Hypertension     Past Surgical History:  Procedure Laterality Date   COLONOSCOPY WITH PROPOFOL  N/A 09/11/2016   Procedure: COLONOSCOPY WITH PROPOFOL ;  Surgeon: Deveron Fly, MD;  Location: [facility] ENDOSCOPY;  Service: Endoscopy;  Laterality: N/A;   ESOPHAGOGASTRODUODENOSCOPY (EGD) WITH PROPOFOL  N/A 09/11/2016   Procedure: ESOPHAGOGASTRODUODENOSCOPY (EGD) WITH PROPOFOL ;  Surgeon: Deveron Fly, MD;  Location: [facility] ENDOSCOPY;  Service: Endoscopy;  Laterality: N/A;   EYE SURGERY Bilateral    JOINT REPLACEMENT Right 02/08/2014    Social History   Socioeconomic History   Marital status: Married    Spouse name: Not on file   Number of children: Not on file   Years of education: Not on file   Highest education level: Not on file  Occupational History   Not on file  Tobacco Use   Smoking status: Never   Smokeless tobacco: Never  Substance and Sexual Activity   Alcohol use: Yes    Comment: WINE ONCE A DAY   Drug use: No   Sexual activity: Yes  Other Topics Concern   Not on file  Social History Narrative   Not on file   Social Drivers of Health   Financial Resource Strain: Not on file  Food Insecurity: Not on file  Transportation Needs: Not on file  Physical Activity: Not on file  Stress: Not on file  Social Connections: Not on file  Intimate Partner  Violence: Not on file    No family history on file.  No Known Allergies  Outpatient Medications Prior to Visit  Medication Sig Note   Multiple Vitamins-Minerals (CENTRUM SILVER 50+MEN) TABS Take 1 tablet by mouth daily.    omeprazole  (PRILOSEC) 40 MG capsule Take 1 capsule (40 mg total) by mouth every morning. 08/19/2023: Wants refill to take on retreat.   ramipril  (ALTACE ) 2.5 MG capsule TAKE 1 CAPSULE BY MOUTH EVERY MORNING    sildenafil (VIAGRA) 100 MG tablet Take 100 mg by mouth as needed for erectile dysfunction.    [DISCONTINUED] simvastatin  (ZOCOR ) 10 MG tablet Take 1 tablet (10 mg total) by mouth every evening. 08/19/2023: Needs refill   doxycycline (VIBRA-TABS) 100 MG tablet Take 100 mg by mouth 2 (two) times daily. (Patient not taking: Reported on 07/16/2022)    No facility-administered medications prior to visit.    Review of Systems  Constitutional: Negative.   HENT: Negative.    Eyes: Negative.   Respiratory: Negative.    Cardiovascular: Negative.   Gastrointestinal: Negative.   Genitourinary: Negative.   Skin:        Recently underwent multiple biopsies  Neurological: Negative.   Endo/Heme/Allergies: Negative.        Objective:   BP 130/80   Pulse 66   Temp 97.6 F (36.4 C)   Ht 6\' 1"  (1.854  m)   Wt 215 lb 12.8 oz (97.9 kg)   SpO2 97%   BMI 28.47 kg/m   Vitals:   08/19/23 1012  BP: 130/80  Pulse: 66  Temp: 97.6 F (36.4 C)  Height: 6\' 1"  (1.854 m)  Weight: 215 lb 12.8 oz (97.9 kg)  SpO2: 97%  BMI (Calculated): 28.48    Physical Exam Vitals reviewed.  Constitutional:      Appearance: Normal appearance.  HENT:     Head: Normocephalic.     Left Ear: There is no impacted cerumen.     Nose: Nose normal.     Mouth/Throat:     Mouth: Mucous membranes are moist.     Pharynx: No posterior oropharyngeal erythema.  Eyes:     Extraocular Movements: Extraocular movements intact.     Pupils: Pupils are equal, round, and reactive to light.   Cardiovascular:     Rate and Rhythm: Regular rhythm.     Chest Wall: PMI is not displaced.     Pulses: Normal pulses.     Heart sounds: Normal heart sounds. No murmur heard. Pulmonary:     Effort: Pulmonary effort is normal.     Breath sounds: Normal air entry. No rhonchi or rales.  Abdominal:     General: Abdomen is flat. Bowel sounds are normal. There is no distension.     Palpations: Abdomen is soft. There is no hepatomegaly, splenomegaly or mass.     Tenderness: There is no abdominal tenderness.  Musculoskeletal:        General: Normal range of motion.     Cervical back: Normal range of motion and neck supple.     Right lower leg: No edema.     Left lower leg: Normal. No swelling. No edema.  Skin:    General: Skin is warm and dry.  Neurological:     General: No focal deficit present.     Mental Status: He is alert and oriented to person, place, and time.     Cranial Nerves: No cranial nerve deficit.     Motor: No weakness.  Psychiatric:        Mood and Affect: Mood normal.        Behavior: Behavior normal.      No results found for any visits on 08/19/23.  Recent Results (from the past 2160 hours)  BMP8+Anion Gap     Status: Abnormal   Collection Time: 05/24/23  8:11 AM  Result Value Ref Range   Glucose 110 (H) 70 - 99 mg/dL   BUN 20 10 - 36 mg/dL   Creatinine, Ser 1.61 (H) 0.76 - 1.27 mg/dL   eGFR 47 (L) >09 UE/AVW/0.98   BUN/Creatinine Ratio 14 10 - 24   Sodium 144 134 - 144 mmol/L   Potassium 4.2 3.5 - 5.2 mmol/L   Chloride 104 96 - 106 mmol/L   CO2 23 20 - 29 mmol/L   Anion Gap 17.0 10.0 - 18.0 mmol/L   Calcium 9.4 8.6 - 10.2 mg/dL  Resp panel by RT-PCR (RSV, Flu A&B, Covid) Anterior Nasal Swab     Status: Abnormal   Collection Time: 07/25/23  9:43 AM   Specimen: Anterior Nasal Swab  Result Value Ref Range   SARS Coronavirus 2 by RT PCR POSITIVE (A) NEGATIVE    Comment: (NOTE) SARS-CoV-2 target nucleic acids are DETECTED.  The SARS-CoV-2 RNA is  generally detectable in upper respiratory specimens during the acute phase of infection. Positive results are indicative of the  presence of the identified virus, but do not rule out bacterial infection or co-infection with other pathogens not detected by the test. Clinical correlation with patient history and other diagnostic information is necessary to determine patient infection status. The expected result is Negative.  Fact Sheet for Patients: BloggerCourse.com  Fact Sheet for Healthcare Providers: SeriousBroker.it  This test is not yet approved or cleared by the [place]  FDA and  has been authorized for detection and/or diagnosis of SARS-CoV-2 by FDA under an Emergency Use Authorization (EUA).  This EUA will remain in effect (meaning this test can be used) for the duration of  the COVID-19 declaration under Section 564(b)(1) of the A ct, 21 U.Misty Foutz.C. section 360bbb-3(b)(1), unless the authorization is terminated or revoked sooner.     Influenza A by PCR NEGATIVE NEGATIVE   Influenza B by PCR NEGATIVE NEGATIVE    Comment: (NOTE) The Xpert Xpress SARS-CoV-2/FLU/RSV plus assay is intended as an aid in the diagnosis of influenza from Nasopharyngeal swab specimens and should not be used as a sole basis for treatment. Nasal washings and aspirates are unacceptable for Xpert Xpress SARS-CoV-2/FLU/RSV testing.  Fact Sheet for Patients: BloggerCourse.com  Fact Sheet for Healthcare Providers: SeriousBroker.it  This test is not yet approved or cleared by the [place]  FDA and has been authorized for detection and/or diagnosis of SARS-CoV-2 by FDA under an Emergency Use Authorization (EUA). This EUA will remain in effect (meaning this test can be used) for the duration of the COVID-19 declaration under Section 564(b)(1) of the Act, 21 U.Daleysa Kristiansen.C. section 360bbb-3(b)(1), unless the  authorization is terminated or revoked.     Resp Syncytial Virus by PCR NEGATIVE NEGATIVE    Comment: (NOTE) Fact Sheet for Patients: BloggerCourse.com  Fact Sheet for Healthcare Providers: SeriousBroker.it  This test is not yet approved or cleared by the [place]  FDA and has been authorized for detection and/or diagnosis of SARS-CoV-2 by FDA under an Emergency Use Authorization (EUA). This EUA will remain in effect (meaning this test can be used) for the duration of the COVID-19 declaration under Section 564(b)(1) of the Act, 21 U.Uchechukwu Dhawan.C. section 360bbb-3(b)(1), unless the authorization is terminated or revoked.  Performed at [facility], [address], [place], [place] [address]   Comprehensive metabolic panel with GFR     Status: Abnormal   Collection Time: 08/15/23  8:21 AM  Result Value Ref Range   Glucose 100 (H) 70 - 99 mg/dL   BUN 32 10 - 36 mg/dL   Creatinine, Ser 6.04 (H) 0.76 - 1.27 mg/dL   eGFR 16 (L) >54 UJ/WJX/9.14   BUN/Creatinine Ratio 9 (L) 10 - 24   Sodium 144 134 - 144 mmol/L   Potassium 4.6 3.5 - 5.2 mmol/L   Chloride 107 (H) 96 - 106 mmol/L   CO2 20 20 - 29 mmol/L   Calcium 9.1 8.6 - 10.2 mg/dL   Total Protein 6.5 6.0 - 8.5 g/dL   Albumin 4.2 3.6 - 4.6 g/dL   Globulin, Total 2.3 1.5 - 4.5 g/dL   Bilirubin Total 0.3 0.0 - 1.2 mg/dL   Alkaline Phosphatase 90 44 - 121 IU/L   AST 15 0 - 40 IU/L   ALT 11 0 - 44 IU/L  Lipid panel     Status: None   Collection Time: 08/15/23  8:21 AM  Result Value Ref Range   Cholesterol, Total 140 100 - 199 mg/dL   Triglycerides 93 0 - 149 mg/dL   HDL 41 >78 mg/dL  VLDL Cholesterol Cal 18 5 - 40 mg/dL   LDL Chol Calc (NIH) 81 0 - 99 mg/dL   Chol/HDL Ratio 3.4 0.0 - 5.0 ratio    Comment:                                   T. Chol/HDL Ratio                                             Men  Women                               1/2 Avg.Risk  3.4     3.3                                   Avg.Risk  5.0    4.4                                2X Avg.Risk  9.6    7.1                                3X Avg.Risk 23.4   11.0       Assessment & Plan:  As per problem list  Problem List Items Addressed This Visit       Genitourinary   CKD (chronic kidney disease) stage 4, GFR 15-29 ml/min (HCC) - Primary   Relevant Orders   Ambulatory referral to Nephrology   US  Renal   POC CREATINE & ALBUMIN,URINE   POCT Urinalysis Dipstick (78295)     Other   Mixed hyperlipidemia   Relevant Medications   simvastatin  (ZOCOR ) 10 MG tablet    Return in about 3 months (around 11/18/2023).   Total time spent: 20 minutes  Arzella Bitters, MD  08/19/2023   This document may have been prepared by [facility] Voice Recognition software and as such may include unintentional dictation errors.

## 2023-09-11 ENCOUNTER — Ambulatory Visit (INDEPENDENT_AMBULATORY_CARE_PROVIDER_SITE_OTHER)

## 2023-09-11 DIAGNOSIS — N184 Chronic kidney disease, stage 4 (severe): Secondary | ICD-10-CM | POA: Diagnosis not present

## 2023-09-13 ENCOUNTER — Ambulatory Visit: Payer: Self-pay | Admitting: Internal Medicine

## 2023-09-13 DIAGNOSIS — N1330 Unspecified hydronephrosis: Secondary | ICD-10-CM

## 2023-09-24 ENCOUNTER — Telehealth: Payer: Self-pay

## 2023-09-24 NOTE — Telephone Encounter (Signed)
 Patient called back and said that he doesn't want to see Dr Cherlynn Cornfield  at [facility] and he wants to see Dr Homero Luster at [facility] and wants it done in 2 days he doesn't want to wait for them, I tried to explain to him that it will take more then 2 days since his insurance will have to auth it more than likely and pt was not understanding this.

## 2023-09-24 NOTE — Addendum Note (Signed)
 Addended by: Vanda Gemma AHMAD on: 09/24/2023 12:51 PM   Modules accepted: Orders

## 2023-09-24 NOTE — Telephone Encounter (Signed)
 Patient daughter called following up about results that were given to patient this morning he was confused, daughter informed that the patient was being referred to urology, checked with HIPAA in our old system, printed it to be scanned and need to have them fill out another on at next visit.

## 2023-09-24 NOTE — Addendum Note (Signed)
 Addended by: Vanda Gemma AHMAD on: 09/24/2023 02:16 PM   Modules accepted: Orders

## 2023-09-25 ENCOUNTER — Other Ambulatory Visit: Payer: Self-pay

## 2023-09-25 ENCOUNTER — Emergency Department (HOSPITAL_BASED_OUTPATIENT_CLINIC_OR_DEPARTMENT_OTHER): Admission: EM | Admit: 2023-09-25 | Discharge: 2023-09-25 | Disposition: A | Discharge status: Home / Self Care

## 2023-09-25 ENCOUNTER — Telehealth: Payer: Self-pay

## 2023-09-25 NOTE — Telephone Encounter (Signed)
 Pt's daughter called and left vm regarding pt, just letting us  know that pt is going to the ER due to losing weight, diarrhea, and trying to get in with a urologist. Pt's daughter called wanting to make sure that ER has all medical records from us , but I just wanted to make you aware

## 2023-09-26 NOTE — Telephone Encounter (Signed)
 noted

## 2023-09-26 NOTE — Telephone Encounter (Signed)
 Pt did not stay in the ED he was there for 1 hr and left before they could triage him

## 2023-11-10 NOTE — Progress Notes (Unsigned)
 Location:  [facility]  POS: Clinic  Provider: Tawni America, ANP  Code Status: Full code  Goals of Care:     11/12/2023    8:45 AM  Advanced Directives  Does Patient Have a Medical Advance Directive? Yes  Type of Estate agent of [place];Living will  Does patient want to make changes to medical advance directive? No - Patient declined  Copy of Healthcare Power of Attorney in Chart? No - copy requested     Chief Complaint  Patient presents with   Establish Care    New Patient Appointment    HPI: Discussed the use of AI scribe software for clinical note transcription with the patient, who gave verbal consent to proceed.  History of Present Illness   The patient is a 89 year old with benign prostatic hyperplasia and chronic kidney disease who presents for establishment of care.Mario Baxter He is accompanied by his son.  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 He is under the care of a urologist and mentioned that a cystoscopy may be performed to evaluate his bladder. He has been taking finasteride to manage prostate size.  He has chronic kidney disease with a noted decline in kidney function over the past year. Creatinine levels have improved since catheter insertion, with recent labs showing a creatinine of 1.72 mg/dL and BUN of 21 mg/dL. He is under the care of a nephrologist and has been switched to Norvasc (amlodipine) for blood pressure management.   He has experienced recent digestive issues, including episodes of diarrhea over the past few weeks, but no abdominal pain or cramping. No specific dietary triggers have been identified, and he has not been on antibiotics recently. He has a history of GERD but is no longer taking omeprazole .  He has a  history of anemia, with a hemoglobin level of 9.8 g/dL noted in June, attributed to his chronic kidney disease. He feels tired and has reduced his walking, which he attributes to the presence of the catheter.  His past medical history includes GERD, high sodium levels, mixed hyperlipidemia, prediabetes, hypertension, and a right hip replacement in 2015. 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 No recent falls, memory concerns, or depression. He is currently taking Norvasc, finasteride, and a vitamin supplement.     No falls No memory issues No depression    Followed by urology found to have bilateral hydronephrosis 09/13/23 on US  Also with worsening CKD followed by nephrology. Change to norvasc for HTN 6/13  Was walking for exercise but has avoided this for the past month due to fatigue.  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, mild stage    Hyperlipidemia    Hypertension     Past Surgical History:  Procedure Laterality Date   BASAL CELL CARCINOMA EXCISION     COLONOSCOPY WITH PROPOFOL  N/A 09/11/2016   Procedure: COLONOSCOPY WITH PROPOFOL ;  Surgeon: Gaylyn Gladis PENNER, MD;  Location: [facility] ENDOSCOPY;  Service: Endoscopy;  Laterality: N/A;   ESOPHAGOGASTRODUODENOSCOPY (EGD) WITH PROPOFOL  N/A 09/11/2016   Procedure: ESOPHAGOGASTRODUODENOSCOPY (EGD) WITH PROPOFOL ;  Surgeon: Gaylyn Gladis PENNER, MD;  Location: [facility] ENDOSCOPY;  Service: Endoscopy;  Laterality: N/A;   EYE SURGERY Bilateral    JOINT REPLACEMENT Right 02/08/2014    No Known Allergies  Outpatient Encounter Medications as of 11/12/2023  Medication Sig   amLODipine (NORVASC) 10 MG tablet Take 10 mg by mouth daily.   amoxicillin (AMOXIL) 500 MG capsule Take 500 mg by mouth.   finasteride (PROSCAR) 5 MG tablet  Take 5 mg by mouth daily.   Multiple Vitamins-Minerals (CENTRUM SILVER 50+MEN) TABS Take 1 tablet by mouth daily.   polyethylene glycol powder (GLYCOLAX /MIRALAX ) 17 GM/SCOOP powder Take 17 g by mouth every other day.   simvastatin  (ZOCOR ) 10 MG tablet Take 1 tablet (10 mg total) by mouth every evening.   [DISCONTINUED] omeprazole  (PRILOSEC) 40 MG capsule Take 1 capsule (40 mg total) by mouth every morning.   [DISCONTINUED] doxycycline (VIBRA-TABS) 100 MG tablet Take 100 mg by mouth 2 (two) times daily. (Patient not taking: Reported on 11/12/2023)   [DISCONTINUED] ramipril  (ALTACE ) 2.5 MG capsule TAKE 1 CAPSULE BY MOUTH EVERY MORNING (Patient not taking: Reported on 11/12/2023)   [DISCONTINUED] sildenafil (VIAGRA) 100 MG tablet Take 100 mg by mouth as needed for erectile dysfunction. (Patient not taking: Reported on 11/12/2023)   No facility-administered encounter medications on file as of 11/12/2023.    Review of Systems:  Review of Systems  Constitutional:  Positive for activity change and fatigue. Negative for appetite change, chills, diaphoresis, fever and unexpected weight change.  HENT:  Negative for congestion.   Respiratory:  Negative for cough, shortness of breath, wheezing and stridor.   Cardiovascular:  Negative for chest pain, palpitations and leg swelling.  Gastrointestinal:  Positive for diarrhea. Negative for abdominal distention, abdominal pain and constipation.  Genitourinary:  Positive for difficulty urinating (has foley). Negative for dysuria, flank pain, frequency and hematuria.  Musculoskeletal:  Negative for arthralgias, back pain, gait problem, joint swelling and myalgias.  Skin:  Negative for wound.  Neurological:  Negative for dizziness, seizures, syncope, facial asymmetry, speech difficulty, weakness and headaches.  Hematological:  Negative for adenopathy. Does not bruise/bleed easily.  Psychiatric/Behavioral:  Negative for agitation, behavioral problems and confusion.      Health Maintenance  Topic Date Due   DTaP/Tdap/Td (1 - Tdap) Never done   Pneumococcal Vaccine: 50+ Years (1 of 2 - PCV) Never done   Zoster Vaccines- Shingrix (1 of 2) Never done   COVID-19 Vaccine (3 - Pfizer risk series) 06/27/2019   Medicare Annual Wellness (AWV)  10/17/2023   INFLUENZA VACCINE  11/29/2023   Hepatitis B Vaccines  Aged Out   HPV VACCINES  Aged Out   Meningococcal B Vaccine  Aged Out    Physical Exam: Vitals:   11/12/23 0851  BP: 134/76  Pulse: 72  Resp: 19  Temp: 97.8 F (36.6 C)  SpO2: 97%  Weight: 205 lb 3.2 oz (93.1 kg)  Height: 6' 1 (1.854 m)   Body mass index is 27.07 kg/m. Wt Readings from Last 3 Encounters:  11/12/23 205 lb 3.2 oz (93.1 kg)  08/19/23 215 lb 12.8 oz (97.9 kg)  07/25/23 203 lb (92.1 kg)    Physical Exam Vitals and nursing note reviewed.  Constitutional:      Appearance: Normal appearance.  HENT:     Head: Normocephalic and atraumatic.     Right Ear: Tympanic membrane normal.     Left Ear: Tympanic membrane normal.     Nose: Nose normal.  Mouth/Throat:     Mouth: Mucous membranes are moist.     Pharynx: Oropharynx is clear.  Eyes:     Conjunctiva/sclera: Conjunctivae normal.     Pupils: Pupils are equal, round, and reactive to light.  Cardiovascular:     Rate and Rhythm: Normal rate and regular rhythm.     Heart sounds: No murmur heard. Pulmonary:     Effort: Pulmonary effort is normal. No respiratory distress.     Breath sounds: Normal breath sounds. No wheezing.  Abdominal:     General: Bowel sounds are normal. There is no distension.     Palpations: Abdomen is soft.     Tenderness: There is no abdominal tenderness.  Genitourinary:    Comments: Foley with clear yellow urine Musculoskeletal:     Cervical back: Normal range of motion. No rigidity.     Right lower leg: No edema.     Left lower leg: No edema.     Comments: Strength 5/5 BUE and BLE  Lymphadenopathy:     Cervical: No cervical adenopathy.   Skin:    General: Skin is warm and dry.  Neurological:     General: No focal deficit present.     Mental Status: He is alert and oriented to person, place, and time. Mental status is at baseline.  Psychiatric:        Mood and Affect: Mood normal.     Labs reviewed: Basic Metabolic Panel: Recent Labs    05/02/23 0817 05/24/23 0811 08/15/23 0821  NA 145* 144 144  K 4.4 4.2 4.6  CL 107* 104 107*  CO2 24 23 20   GLUCOSE 107* 110* 100*  BUN 22 20 32  CREATININE 1.38* 1.42* 3.44*  CALCIUM 9.5 9.4 9.1   Liver Function Tests: Recent Labs    01/18/23 0839 05/02/23 0817 08/15/23 0821  AST 19 20 15   ALT 18 20 11   ALKPHOS 78 77 90  BILITOT 0.4 0.4 0.3  PROT 6.1 6.2 6.5  ALBUMIN 4.3 4.2 4.2   No results for input(s): LIPASE, AMYLASE in the last 8760 hours. No results for input(s): AMMONIA in the last 8760 hours. CBC: No results for input(s): WBC, NEUTROABS, HGB, HCT, MCV, PLT in the last 8760 hours. Lipid Panel: Recent Labs    01/18/23 0839 05/02/23 0817 08/15/23 0821  CHOL 149 168 140  HDL 63 62 41  LDLCALC 75 91 81  TRIG 49 77 93  CHOLHDL 2.4 2.7 3.4   Lab Results  Component Value Date   HGBA1C 5.7 (H) 01/18/2023    Procedures since last visit: No results found.  Assessment/Plan Assessment and Plan    Benign Prostatic Hyperplasia (BPH) with Hydronephrosis Enlarged prostate causing urinary retention and hydronephrosis. Foley catheter in place. Finasteride prescribed to shrink prostate. Cystoscopy planned to assess bladder damage. Possible TURP if finasteride ineffective. - Continue finasteride. -followed by urology   Chronic Kidney Disease (CKD) Kidney function improved post-catheter placement. Creatinine decreased to 1.72. Anemia present, likely due to CKD. Under nephrology care. - Follow up with nephrologist on August 7th. - Monitor kidney function and anemia. - Continue current management with nephrologist.  Hypertension Managed  with Norvasc. Previous medication changed due to kidney concerns. Nephrologist to reassess impact on kidney function. - Continue Norvasc. - Monitor blood pressure.   ACD Hgb 9.8 Normal MCV Likely due to CKD May improved He is having labs done at nephrology   Loose stools ?if he has IBS vs consitpation with reflux  try miralax   for regularity if not improving f/u - Monitor symptoms and adjust as needed.  General Health Maintenance Incomplete vaccination records. - Obtain medical records to verify vaccination history.  Goals of Care Prefers resuscitation efforts. - Document resuscitation preference.  Follow-up Establishing care with new geriatric provider. Multiple specialist appointments scheduled. - Schedule follow-up in one month          Labs/tests ordered:  * No order type specified * Not needed will be done at nephrology office.  Next appt:  4 weeks    Total time :  time greater than 50% of total time spent doing pt counseling and coordination of care

## 2023-11-12 ENCOUNTER — Ambulatory Visit: Payer: Self-pay | Admitting: Adult Health

## 2023-11-12 ENCOUNTER — Encounter: Payer: Self-pay | Admitting: Adult Health

## 2023-11-12 VITALS — BP 134/76 | HR 72 | Temp 97.8°F | Resp 19 | Ht 73.0 in | Wt 205.2 lb

## 2023-11-12 DIAGNOSIS — K219 Gastro-esophageal reflux disease without esophagitis: Secondary | ICD-10-CM

## 2023-11-12 DIAGNOSIS — I10 Essential (primary) hypertension: Secondary | ICD-10-CM | POA: Diagnosis not present

## 2023-11-12 DIAGNOSIS — N1831 Chronic kidney disease, stage 3a: Secondary | ICD-10-CM

## 2023-11-12 DIAGNOSIS — K5901 Slow transit constipation: Secondary | ICD-10-CM | POA: Diagnosis not present

## 2023-11-12 DIAGNOSIS — E782 Mixed hyperlipidemia: Secondary | ICD-10-CM

## 2023-11-12 DIAGNOSIS — D638 Anemia in other chronic diseases classified elsewhere: Secondary | ICD-10-CM | POA: Diagnosis not present

## 2023-11-12 DIAGNOSIS — N138 Other obstructive and reflux uropathy: Secondary | ICD-10-CM

## 2023-11-12 DIAGNOSIS — R7303 Prediabetes: Secondary | ICD-10-CM

## 2023-11-12 DIAGNOSIS — N401 Enlarged prostate with lower urinary tract symptoms: Secondary | ICD-10-CM

## 2023-11-12 DIAGNOSIS — D631 Anemia in chronic kidney disease: Secondary | ICD-10-CM | POA: Insufficient documentation

## 2023-11-12 MED ORDER — POLYETHYLENE GLYCOL 3350 17 GM/SCOOP PO POWD: 17.0000 g | ORAL | Status: DC

## 2023-11-13 ENCOUNTER — Other Ambulatory Visit: Payer: Self-pay | Admitting: Urology

## 2023-11-19 ENCOUNTER — Ambulatory Visit: Admitting: Internal Medicine

## 2023-12-10 ENCOUNTER — Telehealth: Payer: Self-pay | Admitting: Family

## 2023-12-10 ENCOUNTER — Encounter: Admitting: Family

## 2023-12-10 NOTE — Telephone Encounter (Signed)
 Please update PCP to Dr.Veludandi per patient's request.

## 2023-12-10 NOTE — Telephone Encounter (Signed)
 Patient asking for a change from Dinah to see Dr Sherlynn. Stating that he prefer to have an MD over a NP. Please advise if Dr V is willing to take him on as her patient and we can call and schedule him a visit with her.

## 2023-12-17 NOTE — Patient Instructions (Signed)
 SURGICAL WAITING ROOM VISITATION Patients having surgery or a procedure may have no more than 2 support people in the waiting area - these visitors may rotate in the visitor waiting room.   Due to an increase in RSV and influenza rates and associated hospitalizations, children ages 71 and under may not visit patients in [facility] hospitals. If the patient needs to stay at the hospital during part of their recovery, the visitor guidelines for inpatient rooms apply.  PRE-OP VISITATION  Pre-op nurse will coordinate an appropriate time for 1 support person to accompany the patient in pre-op.  This support person may not rotate.  This visitor will be contacted when the time is appropriate for the visitor to come back in the pre-op area.  Please refer to the [facility] website for the visitor guidelines for Inpatients (after your surgery is over and you are in a regular room).  You are not required to quarantine at this time prior to your surgery. However, you must do this: Hand Hygiene often Do NOT share personal items Notify your provider if you are in close contact with someone who has COVID or you develop fever 100.4 or greater, new onset of sneezing, cough, sore throat, shortness of breath or body aches.  If you test positive for Covid or have been in contact with anyone that has tested positive in the last 10 days please notify you surgeon.    Your procedure is scheduled on: 12/31/23   Report to [facility] Main Entrance: [place] entrance where the [employer] is available.   Report to admitting at: 5:15 AM  Call this number if you have any questions or problems the morning of surgery [phone]  FOLLOW ANY ADDITIONAL PRE OP INSTRUCTIONS YOU RECEIVED FROM YOUR SURGEON'S OFFICE!!!  Do not eat food or drink fluids after Midnight the night prior to your surgery/procedure.   Oral Hygiene is also important to reduce your risk of infection.        Remember - BRUSH YOUR TEETH  THE MORNING OF SURGERY WITH YOUR REGULAR TOOTHPASTE  Do NOT smoke after Midnight the night before surgery.  STOP TAKING all Vitamins, Herbs and supplements 1 week before your surgery.   Take ONLY these medicines the morning of surgery with A SIP OF WATER: amlodipine,finasteride.  If You have been diagnosed with Sleep Apnea - Bring CPAP mask and tubing day of surgery. We will provide you with a CPAP machine on the day of your surgery.                   You may not have any metal on your body including hair pins, jewelry, and body piercing  Do not wear lotions, powders, perfumes / cologne, or deodorant.  Men may shave face and neck.  Contacts, Hearing Aids, dentures or bridgework may not be worn into surgery. DENTURES WILL BE REMOVED PRIOR TO SURGERY PLEASE DO NOT APPLY Poly grip OR ADHESIVES!!!  You may bring a small overnight bag with you on the day of surgery, only pack items that are not valuable. [facility] IS NOT RESPONSIBLE   FOR VALUABLES THAT ARE LOST OR STOLEN.   Patients discharged on the day of surgery will not be allowed to drive home.  Someone NEEDS to stay with you for the first 24 hours after anesthesia.  Do not bring your home medications to the hospital. The Pharmacy will dispense medications listed on your medication list to you during your admission in the  Hospital.  Special Instructions: Bring a copy of your healthcare power of attorney and living will documents the day of surgery, if you wish to have them scanned into your [facility] Records- EPIC  Please read over the following fact sheets you were given: IF YOU HAVE QUESTIONS ABOUT YOUR PRE-OP INSTRUCTIONS, PLEASE CALL [phone]   [facility] - Preparing for Surgery Before surgery, you can play an important role.  Because skin is not sterile, your skin needs to be as free of germs as possible.  You can reduce the number of germs on your skin by washing with CHG (chlorahexidine gluconate) soap before  surgery.  CHG is an antiseptic cleaner which kills germs and bonds with the skin to continue killing germs even after washing. Please DO NOT use if you have an allergy to CHG or antibacterial soaps.  If your skin becomes reddened/irritated stop using the CHG and inform your nurse when you arrive at Short Stay. Do not shave (including legs and underarms) for at least 48 hours prior to the first CHG shower.  You may shave your face/neck.  Please follow these instructions carefully:  1.  Shower with CHG Soap the night before surgery and the  morning of surgery.  2.  If you choose to wash your hair, wash your hair first as usual with your normal  shampoo.  3.  After you shampoo, rinse your hair and body thoroughly to remove the shampoo.                             4.  Use CHG as you would any other liquid soap.  You can apply chg directly to the skin and wash.  Gently with a scrungie or clean washcloth.  5.  Apply the CHG Soap to your body ONLY FROM THE NECK DOWN.   Do not use on face/ open                           Wound or open sores. Avoid contact with eyes, ears mouth and genitals (private parts).                       Wash face,  Genitals (private parts) with your normal soap.             6.  Wash thoroughly, paying special attention to the area where your  surgery  will be performed.  7.  Thoroughly rinse your body with warm water from the neck down.  8.  DO NOT shower/wash with your normal soap after using and rinsing off the CHG Soap.            9.  Pat yourself dry with a clean towel.            10.  Wear clean pajamas.            11.  Place clean sheets on your bed the night of your first shower and do not  sleep with pets.  ON THE DAY OF SURGERY : Do not apply any lotions/deodorants the morning of surgery.  Please wear clean clothes to the hospital/surgery center.     FAILURE TO FOLLOW THESE INSTRUCTIONS MAY RESULT IN THE CANCELLATION OF YOUR SURGERY  PATIENT  SIGNATURE_________________________________  NURSE SIGNATURE__________________________________  ________________________________________________________________________

## 2023-12-18 ENCOUNTER — Encounter (HOSPITAL_COMMUNITY): Payer: Self-pay

## 2023-12-18 ENCOUNTER — Other Ambulatory Visit: Payer: Self-pay

## 2023-12-18 ENCOUNTER — Encounter (HOSPITAL_COMMUNITY)
Admission: RE | Admit: 2023-12-18 | Discharge: 2023-12-18 | Disposition: A | Discharge status: Home / Self Care | Source: Ambulatory Visit | Attending: Urology | Admitting: Urology

## 2023-12-18 VITALS — BP 147/64 | HR 64 | Temp 97.8°F | Ht 73.5 in | Wt 206.0 lb

## 2023-12-18 DIAGNOSIS — I10 Essential (primary) hypertension: Secondary | ICD-10-CM | POA: Diagnosis not present

## 2023-12-18 DIAGNOSIS — Z01818 Encounter for other preprocedural examination: Secondary | ICD-10-CM | POA: Insufficient documentation

## 2023-12-18 HISTORY — DX: Prediabetes: R73.03

## 2023-12-18 HISTORY — DX: Malignant (primary) neoplasm, unspecified: C80.1

## 2023-12-18 LAB — CBC
HCT: 32.7 % — ABNORMAL LOW (ref 39.0–52.0)
Hemoglobin: 10.5 g/dL — ABNORMAL LOW (ref 13.0–17.0)
MCH: 32.4 pg (ref 26.0–34.0)
MCHC: 32.1 g/dL (ref 30.0–36.0)
MCV: 100.9 fL — ABNORMAL HIGH (ref 80.0–100.0)
Platelets: 254 K/uL (ref 150–400)
RBC: 3.24 MIL/uL — ABNORMAL LOW (ref 4.22–5.81)
RDW: 13.2 % (ref 11.5–15.5)
WBC: 7.5 K/uL (ref 4.0–10.5)
nRBC: 0 % (ref 0.0–0.2)

## 2023-12-18 NOTE — Progress Notes (Addendum)
 For Anesthesia: PCP - Sherlynn Madden, MD  Cardiologist - N/A Woodward Brought, MD : Nephrologist.  Bowel Prep reminder:  Chest x-ray - 07/25/23 EKG - 12/18/23 Stress Test -  ECHO -  Cardiac Cath -  Pacemaker/ICD device last checked: Pacemaker orders received: Device Rep notified:  Spinal Cord Stimulator:N/A  Sleep Study - N/A CPAP -   Fasting Blood Sugar - N/A Checks Blood Sugar _____ times a day Date and result of last Hgb A1c-  Last dose of GLP1 agonist- N/A GLP1 instructions:   Last dose of SGLT-2 inhibitors- N/A SGLT-2 instructions:   Blood Thinner Instructions:N/A Aspirin Instructions: Last Dose:  Activity level: Can go up a flight of stairs and activities of daily living without stopping and without chest pain and/or shortness of breath   Able to exercise without chest pain and/or shortness of breath  Anesthesia review: Hx: CKD 3,HTN  Patient denies shortness of breath, fever, cough and chest pain at PAT appointment   Patient verbalized understanding of instructions that were reviewed over the telephone.

## 2023-12-22 NOTE — Progress Notes (Signed)
   This encounter was created in error - please disregard. No show

## 2023-12-24 ENCOUNTER — Ambulatory Visit: Admitting: Sports Medicine

## 2023-12-27 NOTE — H&P (Signed)
 11/12/23: Mario Baxter returns today in f/u from UDS for cystoscopy. His prostate volume was 88ml on US  and he had the foley replaced post UDS.   UDS SUMMARY  Mario Baxter held a max capacity of approx. 535 mls. His 1st sensation was felt at 428 mls. No instability was noted. He was able to generate a voluntary contraction but did not void. He had to stand in order to attempt to void. Max detrusor while attempting was 36 cm H20. A 16 Coude catheter was reinserted before the patient left the clinic. He will return for UDS follow up.   09/25/23: Mario Baxter is a 89 yo male who is sent for obstructive uropathy with bilateral hydronephrosis on 5/16 on a renal US . He has a reduced stream with intermittency without straining. He has nocturia x 1. He has some urgency. He has been pushing a lot of sodas. His Cr is 3.44 on 4/17 and 4.17 on 08/16/23 . His PSA was 13.5 of in 12/24. He has no prior GU history. He has some abdominal distention and an umbilical hernia.   12/13/2023: Mario Baxter is a 89 year old who presents today for voiding trial. He has surgery scheduled on September 2.     ALLERGIES: None   MEDICATIONS: Finasteride 5 MG Tablet 1 tablet PO Daily  Simvastatin  10 MG Tablet  Tamsulosin HCl 0.4 MG Capsule 1 capsule PO Daily start 5 days prior to next visit.  Centrum Silver  Ramipril  2.5 MG Capsule  Ramipril      GU PSH: Complex cystometrogram, w/ void pressure and urethral pressure profile studies, any technique - 10/22/2023 Complex Uroflow - 10/22/2023 Cystoscopy - 11/12/2023 Emg surf Electrd - 10/22/2023 Intrabd voidng Press - 10/22/2023       PSH Notes: right hip replacement  ear    NON-GU PSH: No Non-GU PSH    GU PMH: BPH w/LUTS, He had primarly bilobar hyperplasia with obstruction and moderate prostate enlargement on cystoscopy and he failed a voiding trial but had a detrusor contraction on UDS. I discussed options for therapy including continued medical therapy with the addition of an alpha blocker and  a voiding trial in a month, TURP, Aquablation, HOLEP and PAE. He would like to continue meds for another month and I will add tamsulosin. Side effects reviewed and he will start that 5 days prior to f/u in a month for his next voiding trial. I will schedule TURP for about 6-8 weeks out to be prepared should he not void. I reviewd the risks of a TURP including bleeding, infection, incontinence, stricture, need for secondary procedures, ejaculatory and erectile dysfunction, thrombotic events, fluid overload and anesthetic complications. I explained that 95% of men will have relief of the obstructive symptoms and about 70% will have relief of the irritative symptoms. - 11/12/2023, He has chronic urinary retention with a PVR of and bilateral hydro with AKI. I will get him started on finasteride and reviewed the side effects and instructions. His bladder will need decompression for several weeks to allow recovery of the detrusor function. I will have him return in a month for a prostate US  to assess his prostate volume and urodynamics to assess his detrusor function. He will need cystoscopy at f/u. I discussed what to look for regarding a post obstructive diuresis and how to manage fluid intake. I will get a BMP on Friday. He has f/u with nephrology next week. , - 09/25/2023 Urinary Retention - 11/12/2023, - 10/22/2023 Acute kidney failure, He has progressive AKI for the  last few months. - 09/25/2023 Elevated PSA, I will repeat this in the future to see the impact of finasteride and will look at the prostate US  to see if there are areas of concern for cancer. He may need a biopsy at some point. - 09/25/2023 Hydronephrosis - 09/25/2023    NON-GU PMH: GERD Glaucoma Hypercholesterolemia Hypertension Skin Cancer, History    FAMILY HISTORY: Death In The Family Father - Other Death In The Family Mother - Other Diabetes - Runs in Family Lung Cancer - Runs in Family   SOCIAL HISTORY: Marital Status:  Married Preferred Language: English; Ethnicity: Not Hispanic Or Latino; Race: White Current Smoking Status: Patient has never smoked.   Tobacco Use Assessment Completed: Used Tobacco in last 30 days? Does not use smokeless tobacco. Does drink.  Does not use drugs. Drinks 1 caffeinated drink per day.    REVIEW OF SYSTEMS:    GU Review Male:   Patient denies frequent urination, hard to postpone urination, burning/ pain with urination, get up at night to urinate, leakage of urine, stream starts and stops, trouble starting your stream, have to strain to urinate , erection problems, and penile pain.  Gastrointestinal (Upper):   Patient denies nausea, indigestion/ heartburn, and vomiting.  Gastrointestinal (Lower):   Patient denies diarrhea and constipation.  Constitutional:   Patient denies fever, night sweats, weight loss, and fatigue.  Musculoskeletal:   Patient denies back pain and joint pain.  Neurological:   Patient denies headaches and dizziness.  Psychologic:   Patient denies depression and anxiety.   VITAL SIGNS: None   GU PHYSICAL EXAMINATION:      Notes: Foley catheter draining amber-colored urine free of clots.     Complexity of Data:  Source Of History:  Patient  Records Review:   Previous Doctor Records, Previous Patient Records   10/22/23  PSA  Total PSA 2.32 ng/mL    PROCEDURES:         Voiding Trial - 48299  Voided Volume: 0 cc  Notes: Voiding trial was unsuccessful   Instilled Volume: 240 cc        Simple Foley Catheterization - 48297  *Urojet was used prior for discomfort. A 16 French Foley catheter was inserted into the bladder using sterile technique. 300 cc of urine was obtained. The patient was taught routine catheter care. Hand irrigation of the bladder with sterile water was performed. A leg bag was connected.         Visit Complexity - G2211    ASSESSMENT:      ICD-10 Details  1 GU:   Urinary Retention - R33.8 Chronic, Stable   PLAN:             Medications Stop Meds: Omeprazole   Discontinue: 12/13/2023  - Reason: The medication cycle was completed.            Document Letter(s):  Created for Patient: Clinical Summary         Notes:   Unsuccessful trial of void. He will keep his surgery as scheduled on 12/31/2023.        Next Appointment:      Next Appointment: 12/31/2023 07:30 AM    Appointment Type: Surgery     Location: [facility] [phone]    Provider: Norleen Seltzer, M.D.    Reason for Visit: OP--OBS--WL--TURP

## 2023-12-29 NOTE — Anesthesia Preprocedure Evaluation (Signed)
 Anesthesia Evaluation  Patient identified by MRN, date of birth, ID band Patient awake    Reviewed: Allergy & Precautions, NPO status , Patient's Chart, lab work & pertinent test results  History of Anesthesia Complications Negative for: history of anesthetic complications  Airway Mallampati: III  TM Distance: >3 FB Neck ROM: Full    Dental  (+) Dental Advisory Given   Pulmonary neg pulmonary ROS   Pulmonary exam normal breath sounds clear to auscultation       Cardiovascular hypertension, (-) angina (-) Past MI, (-) Cardiac Stents and (-) CABG + dysrhythmias (1st degree AV block)  Rhythm:Regular Rate:Normal  HLD   Neuro/Psych negative neurological ROS     GI/Hepatic Neg liver ROS,GERD  ,,  Endo/Other  Pre-diabetes  Renal/GU CRFRenal disease   BPH    Musculoskeletal   Abdominal   Peds  Hematology  (+) Blood dyscrasia, anemia Lab Results      Component                Value               Date                      WBC                      7.5                 12/18/2023                HGB                      10.5 (L)            12/18/2023                HCT                      32.7 (L)            12/18/2023                MCV                      100.9 (H)           12/18/2023                PLT                      254                 12/18/2023              Anesthesia Other Findings   Reproductive/Obstetrics                              Anesthesia Physical Anesthesia Plan  ASA: 2  Anesthesia Plan: General   Post-op Pain Management: Tylenol  PO (pre-op)*   Induction: Intravenous  PONV Risk Score and Plan: 2 and Ondansetron , Dexamethasone  and Treatment may vary due to age or medical condition  Airway Management Planned: LMA  Additional Equipment:   Intra-op Plan:   Post-operative Plan: Extubation in OR  Informed Consent: I have reviewed the patients History and Physical,  chart, labs and discussed the procedure including the risks, benefits and alternatives for the proposed anesthesia with  the patient or authorized representative who has indicated his/her understanding and acceptance.     Dental advisory given  Plan Discussed with: CRNA and Anesthesiologist  Anesthesia Plan Comments: (Risks of general anesthesia discussed including, but not limited to, sore throat, hoarse voice, chipped/damaged teeth, injury to vocal cords, nausea and vomiting, allergic reactions, lung infection, heart attack, stroke, and death. All questions answered. )         Anesthesia Quick Evaluation

## 2023-12-31 ENCOUNTER — Encounter (HOSPITAL_COMMUNITY): Payer: Self-pay | Admitting: Urology

## 2023-12-31 ENCOUNTER — Other Ambulatory Visit: Payer: Self-pay

## 2023-12-31 ENCOUNTER — Observation Stay (HOSPITAL_COMMUNITY)
Admission: RE | Admit: 2023-12-31 | Discharge: 2024-01-01 | Disposition: A | Discharge status: Home / Self Care | Attending: Urology | Admitting: Urology

## 2023-12-31 ENCOUNTER — Ambulatory Visit (HOSPITAL_BASED_OUTPATIENT_CLINIC_OR_DEPARTMENT_OTHER): Payer: Self-pay | Admitting: Anesthesiology

## 2023-12-31 ENCOUNTER — Encounter (HOSPITAL_COMMUNITY)
Admission: RE | Disposition: A | Discharge status: Home / Self Care | Payer: Self-pay | Source: Home / Self Care | Attending: Urology

## 2023-12-31 ENCOUNTER — Ambulatory Visit (HOSPITAL_COMMUNITY): Payer: Self-pay | Admitting: Medical

## 2023-12-31 DIAGNOSIS — Z96641 Presence of right artificial hip joint: Secondary | ICD-10-CM | POA: Insufficient documentation

## 2023-12-31 DIAGNOSIS — Z79899 Other long term (current) drug therapy: Secondary | ICD-10-CM | POA: Diagnosis not present

## 2023-12-31 DIAGNOSIS — N401 Enlarged prostate with lower urinary tract symptoms: Secondary | ICD-10-CM

## 2023-12-31 DIAGNOSIS — I10 Essential (primary) hypertension: Secondary | ICD-10-CM | POA: Insufficient documentation

## 2023-12-31 DIAGNOSIS — Z85828 Personal history of other malignant neoplasm of skin: Secondary | ICD-10-CM | POA: Insufficient documentation

## 2023-12-31 DIAGNOSIS — N138 Other obstructive and reflux uropathy: Principal | ICD-10-CM | POA: Diagnosis present

## 2023-12-31 DIAGNOSIS — R338 Other retention of urine: Secondary | ICD-10-CM | POA: Diagnosis not present

## 2023-12-31 HISTORY — PX: TRANSURETHRAL RESECTION OF PROSTATE: SHX73

## 2023-12-31 SURGERY — TURP (TRANSURETHRAL RESECTION OF PROSTATE)
Anesthesia: General

## 2023-12-31 MED ORDER — SODIUM CHLORIDE 0.9 % IV SOLN
1.0000 g | INTRAVENOUS | Status: DC
  Filled 2023-12-31: qty 10

## 2023-12-31 MED ORDER — CHLORHEXIDINE GLUCONATE 0.12 % MT SOLN
15.0000 mL | Freq: Once | OROMUCOSAL | Status: AC
  Administered 2023-12-31: 15 mL via OROMUCOSAL

## 2023-12-31 MED ORDER — EPHEDRINE SULFATE (PRESSORS) 50 MG/ML IJ SOLN
INTRAMUSCULAR | Status: DC | PRN
  Administered 2023-12-31 (×5): 5 mg via INTRAVENOUS

## 2023-12-31 MED ORDER — PROPOFOL 10 MG/ML IV BOLUS
INTRAVENOUS | Status: AC
  Filled 2023-12-31: qty 20

## 2023-12-31 MED ORDER — FLEET ENEMA RE ENEM: 1.0000 | ENEMA | Freq: Once | RECTAL | Status: DC | PRN

## 2023-12-31 MED ORDER — ONDANSETRON HCL 4 MG/2ML IJ SOLN
INTRAMUSCULAR | Status: AC
Start: 2023-12-31 — End: 2023-12-31
  Filled 2023-12-31: qty 2

## 2023-12-31 MED ORDER — PROPOFOL 10 MG/ML IV BOLUS
INTRAVENOUS | Status: DC | PRN
  Administered 2023-12-31: 100 mg via INTRAVENOUS
  Administered 2023-12-31: 50 mg via INTRAVENOUS

## 2023-12-31 MED ORDER — FENTANYL CITRATE (PF) 100 MCG/2ML IJ SOLN
INTRAMUSCULAR | Status: AC
Start: 2023-12-31 — End: 2023-12-31
  Filled 2023-12-31: qty 2

## 2023-12-31 MED ORDER — ORAL CARE MOUTH RINSE: 15.0000 mL | OROMUCOSAL | Status: DC | PRN

## 2023-12-31 MED ORDER — ZOLPIDEM TARTRATE 5 MG PO TABS: 5.0000 mg | ORAL_TABLET | Freq: Every evening | ORAL | Status: DC | PRN

## 2023-12-31 MED ORDER — ONDANSETRON HCL 4 MG/2ML IJ SOLN: 4.0000 mg | INTRAMUSCULAR | Status: DC | PRN

## 2023-12-31 MED ORDER — OXYBUTYNIN CHLORIDE 5 MG PO TABS: 5.0000 mg | ORAL_TABLET | Freq: Three times a day (TID) | ORAL | Status: DC | PRN

## 2023-12-31 MED ORDER — STERILE WATER FOR IRRIGATION IR SOLN
Status: DC | PRN
  Administered 2023-12-31: 500 mL

## 2023-12-31 MED ORDER — 0.9 % SODIUM CHLORIDE (POUR BTL) OPTIME
TOPICAL | Status: DC | PRN
  Administered 2023-12-31: 1000 mL

## 2023-12-31 MED ORDER — ONDANSETRON HCL 4 MG/2ML IJ SOLN
INTRAMUSCULAR | Status: DC | PRN
  Administered 2023-12-31: 4 mg via INTRAVENOUS

## 2023-12-31 MED ORDER — FENTANYL CITRATE PF 50 MCG/ML IJ SOSY
PREFILLED_SYRINGE | INTRAMUSCULAR | Status: AC
  Filled 2023-12-31: qty 1

## 2023-12-31 MED ORDER — ACETAMINOPHEN 500 MG PO TABS
1000.0000 mg | ORAL_TABLET | Freq: Once | ORAL | Status: AC
  Administered 2023-12-31: 1000 mg via ORAL
  Filled 2023-12-31: qty 2

## 2023-12-31 MED ORDER — FENTANYL CITRATE (PF) 100 MCG/2ML IJ SOLN
INTRAMUSCULAR | Status: DC | PRN
  Administered 2023-12-31: 50 ug via INTRAVENOUS
  Administered 2023-12-31 (×2): 25 ug via INTRAVENOUS

## 2023-12-31 MED ORDER — ACETAMINOPHEN 325 MG PO TABS: 650.0000 mg | ORAL_TABLET | ORAL | Status: DC | PRN

## 2023-12-31 MED ORDER — LACTATED RINGERS IV SOLN: INTRAVENOUS | Status: DC | PRN

## 2023-12-31 MED ORDER — DEXAMETHASONE SODIUM PHOSPHATE 10 MG/ML IJ SOLN
INTRAMUSCULAR | Status: DC | PRN
  Administered 2023-12-31: 5 mg via INTRAVENOUS

## 2023-12-31 MED ORDER — OXYCODONE HCL 5 MG PO TABS: 5.0000 mg | ORAL_TABLET | ORAL | Status: DC | PRN

## 2023-12-31 MED ORDER — HYDROMORPHONE HCL 1 MG/ML IJ SOLN: 0.5000 mg | INTRAMUSCULAR | Status: DC | PRN

## 2023-12-31 MED ORDER — FENTANYL CITRATE PF 50 MCG/ML IJ SOSY
25.0000 ug | PREFILLED_SYRINGE | INTRAMUSCULAR | Status: DC | PRN
  Administered 2023-12-31 (×2): 25 ug via INTRAVENOUS

## 2023-12-31 MED ORDER — ORAL CARE MOUTH RINSE: 15.0000 mL | Freq: Once | OROMUCOSAL | Status: AC

## 2023-12-31 MED ORDER — SENNOSIDES-DOCUSATE SODIUM 8.6-50 MG PO TABS: 1.0000 | ORAL_TABLET | Freq: Every evening | ORAL | Status: DC | PRN

## 2023-12-31 MED ORDER — LACTATED RINGERS IV SOLN: INTRAVENOUS | Status: DC

## 2023-12-31 MED ORDER — SODIUM CHLORIDE 0.9 % IR SOLN
3000.0000 mL | Status: DC
  Administered 2023-12-31 – 2024-01-01 (×3): 3000 mL

## 2023-12-31 MED ORDER — BISACODYL 10 MG RE SUPP: 10.0000 mg | Freq: Every day | RECTAL | Status: DC | PRN

## 2023-12-31 MED ORDER — DEXAMETHASONE SODIUM PHOSPHATE 10 MG/ML IJ SOLN
INTRAMUSCULAR | Status: AC
  Filled 2023-12-31: qty 1

## 2023-12-31 MED ORDER — SODIUM CHLORIDE 0.9 % IR SOLN
Status: DC | PRN
  Administered 2023-12-31: 18000 mL via INTRAVESICAL

## 2023-12-31 MED ORDER — OXYCODONE HCL 5 MG PO TABS: 5.0000 mg | ORAL_TABLET | Freq: Once | ORAL | Status: DC | PRN

## 2023-12-31 MED ORDER — OXYCODONE HCL 5 MG/5ML PO SOLN: 5.0000 mg | Freq: Once | ORAL | Status: DC | PRN

## 2023-12-31 MED ORDER — PHENYLEPHRINE HCL (PRESSORS) 10 MG/ML IV SOLN
INTRAVENOUS | Status: DC | PRN
  Administered 2023-12-31 (×8): 80 ug via INTRAVENOUS

## 2023-12-31 MED ORDER — PHENYLEPHRINE 80 MCG/ML (10ML) SYRINGE FOR IV PUSH (FOR BLOOD PRESSURE SUPPORT)
PREFILLED_SYRINGE | INTRAVENOUS | Status: AC
  Filled 2023-12-31: qty 10

## 2023-12-31 MED ORDER — POTASSIUM CHLORIDE IN NACL 20-0.45 MEQ/L-% IV SOLN
INTRAVENOUS | Status: DC
  Filled 2023-12-31 (×2): qty 1000

## 2023-12-31 MED ORDER — SODIUM CHLORIDE 0.9 % IV SOLN
2.0000 g | INTRAVENOUS | Status: AC
  Administered 2023-12-31: 2 g via INTRAVENOUS
  Filled 2023-12-31: qty 20

## 2023-12-31 MED ORDER — HYDROMORPHONE HCL 1 MG/ML IJ SOLN
INTRAMUSCULAR | Status: AC
  Filled 2023-12-31: qty 1

## 2023-12-31 MED ORDER — AMISULPRIDE (ANTIEMETIC) 5 MG/2ML IV SOLN: 10.0000 mg | Freq: Once | INTRAVENOUS | Status: DC | PRN

## 2023-12-31 MED ORDER — TRANEXAMIC ACID-NACL 1000-0.7 MG/100ML-% IV SOLN
1000.0000 mg | INTRAVENOUS | Status: AC
  Administered 2023-12-31: 1000 mg via INTRAVENOUS
  Filled 2023-12-31: qty 100

## 2023-12-31 SURGICAL SUPPLY — 18 items
BAG URINE DRAIN 2000ML AR STRL (UROLOGICAL SUPPLIES) IMPLANT
BAG URO CATCHER STRL LF (MISCELLANEOUS) ×1 IMPLANT
CATH FOLEY 3WAY 30CC 22FR (CATHETERS) IMPLANT
DRAPE FOOT SWITCH (DRAPES) ×1 IMPLANT
ELECT HOOK LOOP BIPOLAR (NEEDLE) IMPLANT
ELECT REM PT RETURN 15FT ADLT (MISCELLANEOUS) ×1 IMPLANT
GLOVE SURG SS PI 8.0 STRL IVOR (GLOVE) IMPLANT
GOWN STRL SURGICAL XL XLNG (GOWN DISPOSABLE) ×1 IMPLANT
HOLDER FOLEY CATH W/STRAP (MISCELLANEOUS) IMPLANT
KIT TURNOVER KIT A (KITS) ×1 IMPLANT
LOOP CUT BIPOLAR 24F LRG (ELECTROSURGICAL) IMPLANT
MANIFOLD NEPTUNE II (INSTRUMENTS) ×1 IMPLANT
PACK CYSTO (CUSTOM PROCEDURE TRAY) ×1 IMPLANT
PAD PREP 24X48 CUFFED NSTRL (MISCELLANEOUS) ×1 IMPLANT
SYR 30ML LL (SYRINGE) IMPLANT
SYRINGE TOOMEY IRRIG 70ML (MISCELLANEOUS) IMPLANT
TUBING CONNECTING 10 (TUBING) ×1 IMPLANT
TUBING UROLOGY SET (TUBING) ×1 IMPLANT

## 2023-12-31 NOTE — Progress Notes (Signed)
 Attempted report, floor nurse is at lunch. Will call back in 15 minutes and will continue to monitor the patient.

## 2023-12-31 NOTE — Transfer of Care (Signed)
 Immediate Anesthesia Transfer of Care Note  Patient: Mario Baxter  Procedure(s) Performed: TURP (TRANSURETHRAL RESECTION OF PROSTATE)  Patient Location: PACU  Anesthesia Type:General  Level of Consciousness: awake and alert   Airway & Oxygen Therapy: Patient Spontanous Breathing and Patient connected to nasal cannula oxygen  Post-op Assessment: Report given to RN and Post -op Vital signs reviewed and stable  Post vital signs: Reviewed and stable  Last Vitals:  Vitals Value Taken Time  BP 128/61 12/31/23 08:45  Temp 36.1 C 12/31/23 08:45  Pulse 61 12/31/23 08:59  Resp 18 12/31/23 08:59  SpO2 90 % 12/31/23 08:59  Vitals shown include unfiled device data.  Last Pain:  Vitals:   12/31/23 9367  TempSrc:   PainSc: 0-No pain      Patients Stated Pain Goal: 5 (12/31/23 9372)  Complications: No notable events documented.

## 2023-12-31 NOTE — Anesthesia Postprocedure Evaluation (Signed)
 Anesthesia Post Note  Patient: Mario Baxter  Procedure(s) Performed: TURP (TRANSURETHRAL RESECTION OF PROSTATE)     Patient location during evaluation: PACU Anesthesia Type: General Level of consciousness: awake Pain management: pain level controlled Vital Signs Assessment: post-procedure vital signs reviewed and stable Respiratory status: spontaneous breathing, nonlabored ventilation and respiratory function stable Cardiovascular status: blood pressure returned to baseline and stable Postop Assessment: no apparent nausea or vomiting Anesthetic complications: no   No notable events documented.  Last Vitals:  Vitals:   12/31/23 0930 12/31/23 0945  BP: 130/67 129/68  Pulse: (!) 57 (!) 51  Resp:    Temp:    SpO2: 90% 98%    Last Pain:  Vitals:   12/31/23 1003  TempSrc:   PainSc: 3                  Delon Aisha Arch

## 2023-12-31 NOTE — Interval H&P Note (Signed)
 History and Physical Interval Note:  No change  12/31/2023 7:21 AM  Mario Baxter  has presented today for surgery, with the diagnosis of BENIGN PROSTATIC HYPERPLASIA.  The various methods of treatment have been discussed with the patient and family. After consideration of risks, benefits and other options for treatment, the patient has consented to  Procedure(s): TURP (TRANSURETHRAL RESECTION OF PROSTATE) (N/A) as a surgical intervention.  The patient's history has been reviewed, patient examined, no change in status, stable for surgery.  I have reviewed the patient's chart and labs.  Questions were answered to the patient's satisfaction.     Versie Soave

## 2023-12-31 NOTE — Op Note (Signed)
 Procedure: Cystoscopy with transurethral resection of the prostate.  Preop diagnosis: BPH with bladder outlet obstruction and retention.  Postop diagnosis: Same.  Surgeon: Dr. Norleen Seltzer.  Anesthesia: General.  Specimen: Prostate chips.  Drains: 22 French three-way Foley catheter.  EBL: 100 mL.  Complications: None.  Indications: Patient is a 89 year old male with BPH with retention is elected undergo TURP.  Procedure: He was taken the operating room where he was given Rocephin  and TXA.  A general anesthetic was induced.  He was placed in lithotomy position and fitted with PAS hose.  His perineum and genitalia were prepped with Betadine solution he was draped in usual sterile fashion.  Cystoscopy was performed using the 21 [place] scope and 30 degree lens.  Examination of a normal urethra.  The external sphincter was intact.  The prostatic urethra was approximately 3 to 4 cm in length with lateral lobe hyperplasia and a high bladder neck without a significant middle lobe.  Examination of bladder revealed moderate severe trabeculation without tumors, stones or inflammation.  There was some catheter irritation on the posterior wall.  The 36 French continuous-flow resectoscope sheath was then placed without difficulty with the aid of visual obturator.  The Cobre handle with a bipolar loop and 30 degree lens was then placed and saline was used the irrigant.  The bladder neck was then resected from 5-7 o'clock down to the capsular fibers then the floor of the prostate was resected out to alongside the verumontanum.  The left lobe of the prostate was resected from bladder neck to apex out to the capsular fibers and this was followed by the right lobe.  Chips were evacuated as needed and hemostasis achieved as the procedure progressed.  Residual apical and anterior tissue was then removed as needed.  Once an adequate channel had been created the bladder was evacuated free of chips and hemostasis  was achieved.  The scope was then removed and pressure on the bladder produced an excellent stream.  The 22 French three-way Foley catheter was placed with the aid of a catheter guide, the balloon was filled with 30 mL of sterile fluid and the catheter was then irrigated with clear return.  The catheter was then placed to straight drainage and continuous irrigation.  He was taken down from lithotomy position, his anesthetic was reversed and he was moved recovery in stable condition.  There were no complications.

## 2023-12-31 NOTE — Anesthesia Procedure Notes (Signed)
 Procedure Name: Intubation Date/Time: 12/31/2023 7:37 AM  Performed by: Dartha Meckel, CRNAPre-anesthesia Checklist: Patient identified, Emergency Drugs available, Suction available and Patient being monitored Patient Re-evaluated:Patient Re-evaluated prior to induction Oxygen Delivery Method: Circle system utilized Preoxygenation: Pre-oxygenation with 100% oxygen Induction Type: IV induction Ventilation: Mask ventilation without difficulty LMA: LMA with gastric port inserted LMA Size: 4.0 Tube type: Oral Number of attempts: 1 Airway Equipment and Method: Stylet and Oral airway Placement Confirmation: positive ETCO2 and breath sounds checked- equal and bilateral Tube secured with: Tape Dental Injury: Teeth and Oropharynx as per pre-operative assessment

## 2024-01-01 ENCOUNTER — Encounter (HOSPITAL_COMMUNITY): Payer: Self-pay | Admitting: Urology

## 2024-01-01 DIAGNOSIS — N401 Enlarged prostate with lower urinary tract symptoms: Secondary | ICD-10-CM | POA: Diagnosis not present

## 2024-01-01 LAB — SURGICAL PATHOLOGY

## 2024-01-01 MED ORDER — CEFDINIR 300 MG PO CAPS: 300.0000 mg | ORAL_CAPSULE | Freq: Two times a day (BID) | ORAL | 0 refills | Status: AC

## 2024-01-01 NOTE — Progress Notes (Signed)
 Patient and family given postop foley education.RN answered questions and patient performed foley care and bag replacement through demonstration. Supplies given to patient.

## 2024-01-01 NOTE — Care Management Obs Status (Signed)
 MEDICARE OBSERVATION STATUS NOTIFICATION   Patient Details  Name: Mario Baxter MRN: [id number] Date of Birth: 01/13/1931   Medicare Observation Status Notification Given:  Yes    MahabirNathanel, RN 01/01/2024, 9:50 AM

## 2024-01-01 NOTE — TOC Transition Note (Signed)
 Transition of Care [facility]) - Discharge Note   Patient Details  Name: Mario Baxter MRN: [id number] Date of Birth: 1930-12-13  Transition of Care [facility]) CM/SW Contact:  Bascom Service, RN Phone Number: 01/01/2024, 10:09 AM   Clinical Narrative: d/c home no CM needs.      Final next level of care: Home/Self Care Barriers to Discharge: No Barriers Identified   Patient Goals and CMS Choice Patient states their goals for this hospitalization and ongoing recovery are:: Home CMS Medicare.gov Compare Post Acute Care list provided to:: Patient Choice offered to / list presented to : Patient  ownership interest in [facility].provided to:: Patient    Discharge Placement                       Discharge Plan and Services Additional resources added to the After Visit Summary for                                       Social Drivers of Health (SDOH) Interventions SDOH Screenings   Food Insecurity: No Food Insecurity (12/31/2023)  Housing: Low Risk  (12/31/2023)  Transportation Needs: No Transportation Needs (12/31/2023)  Utilities: Not At Risk (12/31/2023)  Depression (PHQ2-9): Low Risk  (11/12/2023)  Social Connections: Socially Integrated (12/31/2023)  Tobacco Use: Low Risk  (12/31/2023)     Readmission Risk Interventions     No data to display

## 2024-01-01 NOTE — Discharge Summary (Signed)
 Physician Discharge Summary  Patient ID: Mario Baxter MRN: [id number] DOB/AGE: 88/07/1930 88 y.o.  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 male who was admitted 12/31/2023 with a diagnosis of BPH with urinary obstruction and went to the operating room on 12/31/2023 and underwent the above named procedures.  He did well and his urine remained clear.  IV was removed this morning and he was discharged home with the foley to return Friday to the office for a voiding trial.    He was given perioperative antibiotics:  Anti-infectives (From admission, onward)    Start     Dose/Rate Route Frequency Ordered Stop   01/01/24 0800  cefTRIAXone  (ROCEPHIN ) 1 g in sodium chloride  0.9 % 100 mL IVPB        1 g 200 mL/hr over 30 Minutes Intravenous Every 24 hours 12/31/23 0734 01/02/24 0759   12/31/23 0541  cefTRIAXone  (ROCEPHIN ) 2 g in sodium chloride  0.9 % 100 mL IVPB        2 g 200 mL/hr over 30 Minutes Intravenous 30 min pre-op 12/31/23 0541 12/31/23 1412     .  He was given sequential compression devices for DVT prophylaxis.  He benefited maximally from the hospital stay and there were no complications.    Inpatient Morphine Milligram Equivalents Per Day 9/2 - 9/3   Values displayed are in units of MME/Day    Order Start / End Date Yesterday Today    oxyCODONE  (Oxy IR/ROXICODONE ) immediate release tablet 5 mg  9/2 - 9/2 0 of Unknown --    oxyCODONE  (ROXICODONE ) 5 MG/5ML solution 5 mg 9/2 - 9/2 0 of Unknown --      Group total: 0 of Unknown     fentaNYL  (SUBLIMAZE ) injection 25-50 mcg 9/2 - 9/2 15 of 45-90 --    oxyCODONE  (Oxy IR/ROXICODONE ) immediate release tablet 5 mg 9/2 - No end date 0 of 37.5 0 of 45    HYDROmorphone  (DILAUDID ) injection 0.5-1 mg 9/2 - No end date 0 of 90-180 0 of 120-240    fentaNYL  (SUBLIMAZE ) injection 9/2 - 9/2 *30 of 30 --    Daily Totals  * 45 of Unknown (at least 202.5-337.5) 0 of 165-285  *One-Step medication  Calculation Errors     Order Type Date Details   oxyCODONE  (Oxy IR/ROXICODONE ) immediate release tablet 5 mg Ordered Dose -- Insufficient frequency information   oxyCODONE  (ROXICODONE ) 5 MG/5ML solution 5 mg Ordered Dose -- Insufficient frequency information            Recent vital signs:  Vitals:   01/01/24 0131 01/01/24 0536  BP: 134/70 136/66  Pulse: (!) 59 66  Resp: 16 17  Temp: 98 F (36.7 C) 97.7 F (36.5 C)  SpO2: 95% 94%    Recent laboratory studies:  Lab Results  Component Value Date   HGB 10.5 (L) 12/18/2023  HGB 12.7 (L) 10/15/2022   HGB 9.5 (L) 02/15/2014   Lab Results  Component Value Date   WBC 7.5 12/18/2023   PLT 254 12/18/2023   Lab Results  Component Value Date   INR 1.1 02/15/2014   Lab Results  Component Value Date   NA 144 08/15/2023   K 4.6 08/15/2023   CL 107 (H) 08/15/2023   CO2 20 08/15/2023   BUN 32 08/15/2023   CREATININE 3.44 (H) 08/15/2023   GLUCOSE 100 (H) 08/15/2023    Discharge Medications:   Allergies as of 01/01/2024   No Known Allergies      Medication List     STOP taking these medications    tamsulosin 0.4 MG Caps capsule Commonly known as: FLOMAX       TAKE these medications    amLODipine 10 MG tablet Commonly known as: NORVASC Take 10 mg by mouth daily.   amoxicillin 500 MG capsule Commonly known as: AMOXIL Take 2,000 mg by mouth once.   Centrum Silver 50+Men  Tabs Take 1 tablet by mouth daily.   finasteride 5 MG tablet Commonly known as: PROSCAR Take 5 mg by mouth daily.   metroNIDAZOLE 1 % gel Commonly known as: METROGEL Apply 1 Application topically 2 (two) times daily. Rosacea   polyethylene glycol powder 17 GM/SCOOP powder Commonly known as: GLYCOLAX /MIRALAX  Take 17 g by mouth every other day.   ramipril  5 MG capsule Commonly known as: ALTACE  Take 5 mg by mouth daily.   simvastatin  10 MG tablet Commonly known as: Zocor  Take 1 tablet (10 mg total) by mouth every evening.        Diagnostic Studies: No results found.  Disposition: Discharge disposition: 01-Home or Self Care       Discharge Instructions     Discontinue IV   Complete by: As directed    Urinary leg bag   Complete by: As directed         Follow-up Information     [facility] Follow up on 01/03/2024.   Why: as scheduled. Contact information: [address] [place]  [address] [phone]                 Signed: Norleen Seltzer 01/01/2024, 6:52 AM

## 2024-01-03 ENCOUNTER — Encounter (HOSPITAL_COMMUNITY): Payer: Self-pay | Admitting: Emergency Medicine

## 2024-01-03 ENCOUNTER — Emergency Department (HOSPITAL_COMMUNITY)
Admission: EM | Admit: 2024-01-03 | Discharge: 2024-01-03 | Disposition: A | Discharge status: Home / Self Care | Attending: Emergency Medicine | Admitting: Emergency Medicine

## 2024-01-03 DIAGNOSIS — R339 Retention of urine, unspecified: Secondary | ICD-10-CM | POA: Insufficient documentation

## 2024-01-03 LAB — URINALYSIS, W/ REFLEX TO CULTURE (INFECTION SUSPECTED)
Bilirubin Urine: NEGATIVE
Glucose, UA: NEGATIVE mg/dL
Ketones, ur: NEGATIVE mg/dL
Leukocytes,Ua: NEGATIVE
Nitrite: NEGATIVE
Protein, ur: 30 mg/dL — AB
Specific Gravity, Urine: 1.002 — ABNORMAL LOW (ref 1.005–1.030)
pH: 7 (ref 5.0–8.0)

## 2024-01-03 NOTE — Discharge Instructions (Signed)
Follow-up with your urologist next week °

## 2024-01-03 NOTE — ED Provider Notes (Signed)
 [place] EMERGENCY DEPARTMENT AT [facility] Provider Note   CSN: [id number] Arrival date & time: 01/03/24  2048     Patient presents with: Urinary Retention   Mario Baxter is a 89 y.o. male.   89 year old male presents with urinary retention since this morning.  States that he recently had a TURP a few days ago.  Saw his urologist today and passed his postvoid residual test.  At that point he started developing gradual suprapubic pressure.  Denies any fever or flank pain.       Prior to Admission medications   Medication Sig Start Date End Date Taking? Authorizing Provider  amLODipine (NORVASC) 10 MG tablet Take 10 mg by mouth daily.    [provider]  amoxicillin (AMOXIL) 500 MG capsule Take 2,000 mg by mouth once. 03/18/14   [provider]  cefdinir  (OMNICEF ) 300 MG capsule Take 1 capsule (300 mg total) by mouth 2 (two) times daily for 7 days. 01/01/24 01/08/24  Watt Rush, MD  finasteride (PROSCAR) 5 MG tablet Take 5 mg by mouth daily.    [provider]  metroNIDAZOLE (METROGEL) 1 % gel Apply 1 Application topically 2 (two) times daily. Rosacea    [provider]  Multiple Vitamins-Minerals (CENTRUM SILVER 50+MEN) TABS Take 1 tablet by mouth daily.    [provider]  polyethylene glycol powder (GLYCOLAX /MIRALAX ) 17 GM/SCOOP powder Take 17 g by mouth every other day. 11/12/23   Darlean Maus, NP  ramipril  (ALTACE ) 5 MG capsule Take 5 mg by mouth daily. 12/11/23 12/10/24  [provider]  simvastatin  (ZOCOR ) 10 MG tablet Take 1 tablet (10 mg total) by mouth every evening. 08/19/23 02/15/24  Albina GORMAN Dine, MD    Allergies: Patient has no known allergies.    Review of Systems  All other systems reviewed and are negative.   Updated Vital Signs BP (!) 182/102   Pulse 67   Temp 97.7 F (36.5 C) (Oral)   Resp 16   SpO2 100%   Physical Exam Vitals and nursing note reviewed.  Constitutional:       General: He is not in acute distress.    Appearance: Normal appearance. He is well-developed. He is not toxic-appearing.  HENT:     Head: Normocephalic and atraumatic.  Eyes:     General: Lids are normal.     Conjunctiva/sclera: Conjunctivae normal.     Pupils: Pupils are equal, round, and reactive to light.  Neck:     Thyroid: No thyroid mass.     Trachea: No tracheal deviation.  Cardiovascular:     Rate and Rhythm: Normal rate and regular rhythm.     Heart sounds: Normal heart sounds. No murmur heard.    No gallop.  Pulmonary:     Effort: Pulmonary effort is normal. No respiratory distress.     Breath sounds: Normal breath sounds. No stridor. No decreased breath sounds, wheezing, rhonchi or rales.  Abdominal:     General: There is no distension.     Palpations: Abdomen is soft.     Tenderness: There is no abdominal tenderness. There is no rebound.  Musculoskeletal:        General: No tenderness. Normal range of motion.     Cervical back: Normal range of motion and neck supple.  Skin:    General: Skin is warm and dry.     Findings: No abrasion or rash.  Neurological:     Mental Status: He is alert and  oriented to person, place, and time. Mental status is at baseline.     GCS: GCS eye subscore is 4. GCS verbal subscore is 5. GCS motor subscore is 6.     Cranial Nerves: No cranial nerve deficit.     Sensory: No sensory deficit.     Motor: Motor function is intact.  Psychiatric:        Attention and Perception: Attention normal.        Speech: Speech normal.        Behavior: Behavior normal.     (all labs ordered are listed, but only abnormal results are displayed) Labs Reviewed  URINALYSIS, W/ REFLEX TO CULTURE (INFECTION SUSPECTED)    EKG: None  Radiology: No results found.   Procedures   Medications Ordered in the ED - No data to display                                  Medical Decision Making  Nursing place Foley with good urine return.  Urinalysis  shows no infection.  Will send home with leg bag and patient to follow-up with his urologist     Final diagnoses:  None    ED Discharge Orders     None          Dasie Faden, MD 01/03/24 2243

## 2024-01-03 NOTE — ED Triage Notes (Signed)
 Patient report urinary rentention. Patient report last urine was this morning. Patient report foley catheter was removed and pass his Post residual this morning. Since this morning patient unable to urinate. Status post TURP last Tuesday.

## 2024-01-03 NOTE — ED Notes (Signed)
 1600 cc emptied from foley bag.

## 2024-02-12 ENCOUNTER — Encounter: Payer: Self-pay | Admitting: Sports Medicine

## 2024-02-12 ENCOUNTER — Ambulatory Visit (INDEPENDENT_AMBULATORY_CARE_PROVIDER_SITE_OTHER): Admitting: Sports Medicine

## 2024-02-12 VITALS — BP 128/82 | HR 62 | Temp 97.8°F | Ht 73.5 in | Wt 208.0 lb

## 2024-02-12 DIAGNOSIS — R5383 Other fatigue: Secondary | ICD-10-CM | POA: Diagnosis not present

## 2024-02-12 DIAGNOSIS — K219 Gastro-esophageal reflux disease without esophagitis: Secondary | ICD-10-CM | POA: Diagnosis not present

## 2024-02-12 DIAGNOSIS — N401 Enlarged prostate with lower urinary tract symptoms: Secondary | ICD-10-CM

## 2024-02-12 DIAGNOSIS — K5900 Constipation, unspecified: Secondary | ICD-10-CM | POA: Diagnosis not present

## 2024-02-12 DIAGNOSIS — N1832 Chronic kidney disease, stage 3b: Secondary | ICD-10-CM

## 2024-02-12 DIAGNOSIS — E782 Mixed hyperlipidemia: Secondary | ICD-10-CM

## 2024-02-12 DIAGNOSIS — N138 Other obstructive and reflux uropathy: Secondary | ICD-10-CM

## 2024-02-12 MED ORDER — SIMVASTATIN 10 MG PO TABS: 10.0000 mg | ORAL_TABLET | Freq: Every evening | ORAL | 1 refills | Status: DC

## 2024-02-12 MED ORDER — COVID-19 MRNA VACCINE (PFIZER) 30 MCG/0.3ML IM SUSP
0.3000 mL | Freq: Once | INTRAMUSCULAR | 0 refills | Status: AC
Start: 2024-02-12 — End: 2024-02-12

## 2024-02-12 NOTE — Progress Notes (Signed)
 Careteam: Patient Care Team: Sherlynn Madden, MD as PCP - General (Internal Medicine)  PLACE OF SERVICE:  [facility]  Advanced Directive information    No Known Allergies  No chief complaint on file.    Discussed the use of AI scribe software for clinical note transcription with the patient, who gave verbal consent to proceed.  History of Present Illness    Mario Baxter is a 89 year old male who presents to establish care with me previously seen by a NP.  He has experienced a change in his voice, describing it as weaker and not his normal deep voice, persisting for about two weeks. There has been some improvement since starting cephalexin for an ear infection. He had COVID-19 approximately seven months ago, and he feels his voice has not fully recovered since then. He also experiences a cough at night and had a runny nose before starting the antibiotic.  He has a history of urinary retention, for which he was hospitalized and had a Foley catheter placed. The catheter has since been removed, and he reports no current issues with urination, such as burning or blood in the urine. He is currently not experiencing any pain with urination.  He describes his digestive system as 'not normal,' with constipation being a primary issue. He reports having a bowel movement after three days, which was hard and required straining. He acknowledges not drinking enough water  but has been improving his fluid intake, including Gatorade. He has not been using stool softeners.  He has experienced a decrease in physical activity, previously walking up to two miles but now not walking at all due to concerns about needing to use the bathroom while away from home. No recent falls and reports his balance as fairly good. He attributes his lack of walking to a decrease in energy and fatigue, which has been more pronounced in the last two to three weeks.  He reports sleeping better, although he wakes  once or twice at night to urinate but can return to sleep. His appetite has decreased over the past two to three weeks, and he has lost weight, dropping from 205 pounds to 199 pounds. He denies difficulty swallowing but noted a sore throat before starting the antibiotic. He also reports a metallic taste in his mouth, which has improved recently.  He lives part-time in a retirement community and part-time in a townhouse. He is independent in dressing and taking medications. He still drives but acknowledges slower reaction times. He has no memory concerns.  Review of Systems:  Review of Systems  Constitutional:  Negative for chills and fever.  Respiratory:  Negative for cough, sputum production and shortness of breath.   Cardiovascular:  Negative for chest pain, palpitations and leg swelling.  Gastrointestinal:  Positive for heartburn. Negative for abdominal pain and nausea.  Genitourinary:  Negative for dysuria, frequency and hematuria.  Musculoskeletal:  Negative for falls.  Neurological:  Negative for dizziness.   Negative unless indicated in HPI.   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, mild stage    Hyperlipidemia    Hypertension    Pre-diabetes    Past Surgical History:  Procedure Laterality Date   BASAL CELL CARCINOMA EXCISION     CATARACT EXTRACTION, BILATERAL     COLONOSCOPY WITH PROPOFOL   N/A 09/11/2016   Procedure: COLONOSCOPY WITH PROPOFOL ;  Surgeon: Gaylyn Gladis PENNER, MD;  Location: [facility];  Service: Endoscopy;  Laterality: N/A;   ESOPHAGOGASTRODUODENOSCOPY (EGD) WITH PROPOFOL  N/A 09/11/2016   Procedure: ESOPHAGOGASTRODUODENOSCOPY (EGD) WITH PROPOFOL ;  Surgeon: Gaylyn Gladis PENNER, MD;  Location: [facility];  Service: Endoscopy;  Laterality: N/A;   EYE SURGERY Bilateral    JOINT REPLACEMENT Right  02/08/2014   hip   TRANSURETHRAL RESECTION OF PROSTATE N/A 12/31/2023   Procedure: TURP (TRANSURETHRAL RESECTION OF PROSTATE);  Surgeon: Watt Rush, MD;  Location: [facility];  Service: Urology;  Laterality: N/A;   Social History:   reports that he has never smoked. He has never been exposed to tobacco smoke. He has never used smokeless tobacco. He reports current alcohol use. He reports that he does not use drugs.  No family history on file.  Medications: Patient's Medications  New Prescriptions   No medications on file  Previous Medications   AMLODIPINE (NORVASC) 10 MG TABLET    Take 10 mg by mouth daily.   AMOXICILLIN (AMOXIL) 500 MG CAPSULE    Take 2,000 mg by mouth once.   FINASTERIDE (PROSCAR) 5 MG TABLET    Take 5 mg by mouth daily.   METRONIDAZOLE (METROGEL) 1 % GEL    Apply 1 Application topically 2 (two) times daily. Rosacea   MULTIPLE VITAMINS-MINERALS (CENTRUM SILVER 50+MEN) TABS    Take 1 tablet by mouth daily.   POLYETHYLENE GLYCOL POWDER (GLYCOLAX /MIRALAX ) 17 GM/SCOOP POWDER    Take 17 g by mouth every other day.   RAMIPRIL  (ALTACE ) 5 MG CAPSULE    Take 5 mg by mouth daily.   SIMVASTATIN  (ZOCOR ) 10 MG TABLET    Take 1 tablet (10 mg total) by mouth every evening.  Modified Medications   No medications on file  Discontinued Medications   No medications on file    Physical Exam: There were no vitals filed for this visit. There is no height or weight on file to calculate BMI. BP Readings from Last 3 Encounters:  01/03/24 (!) 151/77  01/01/24 136/66  12/18/23 (!) 147/64   Wt Readings from Last 3 Encounters:  12/31/23 206 lb (93.4 kg)  12/18/23 206 lb (93.4 kg)  11/12/23 205 lb 3.2 oz (93.1 kg)    Physical Exam Constitutional:      Appearance: Normal appearance.  HENT:     Head: Normocephalic and atraumatic.  Cardiovascular:     Rate and Rhythm: Normal rate and regular rhythm.     Pulses: Normal pulses.     Heart sounds: Normal heart sounds.  Pulmonary:      Effort: No respiratory distress.     Breath sounds: No stridor. No wheezing or rales.  Abdominal:     General: Bowel sounds are normal. There is no distension.     Palpations: Abdomen is soft.     Tenderness: There is no abdominal tenderness. There is no guarding.  Musculoskeletal:        General: No swelling.  Neurological:     Mental Status: He is alert. Mental status is at baseline.     Sensory: No sensory deficit.     Motor: No weakness.     Labs reviewed: Basic Metabolic Panel: Recent Labs    05/02/23 0817 05/24/23 0811 08/15/23 0821  NA 145* 144 144  K 4.4 4.2 4.6  CL 107* 104 107*  CO2 24 23 20   GLUCOSE 107* 110* 100*  BUN 22 20 32  CREATININE 1.38*  1.42* 3.44*  CALCIUM 9.5 9.4 9.1   Liver Function Tests: Recent Labs    05/02/23 0817 08/15/23 0821  AST 20 15  ALT 20 11  ALKPHOS 77 90  BILITOT 0.4 0.3  PROT 6.2 6.5  ALBUMIN 4.2 4.2   No results for input(s): LIPASE, AMYLASE in the last 8760 hours. No results for input(s): AMMONIA in the last 8760 hours. CBC: Recent Labs    12/18/23 0932  WBC 7.5  HGB 10.5*  HCT 32.7*  MCV 100.9*  PLT 254   Lipid Panel: Recent Labs    05/02/23 0817 08/15/23 0821  CHOL 168 140  HDL 62 41  LDLCALC 91 81  TRIG 77 93  CHOLHDL 2.7 3.4   TSH: No results for input(s): TSH in the last 8760 hours. A1C: Lab Results  Component Value Date   HGBA1C 5.7 (H) 01/18/2023    Assessment and Plan Assessment & Plan   1. BPH with obstruction/lower urinary tract symptoms (Primary) Denies dysuria, lower abdominal pain  Followed with urology  Currently on last day of abx Need records from urology  2. Constipation, unspecified constipation type Increase oral hydration and fiber intake  Take colace twice daily Take miralax  prn  3. Gastroesophageal reflux disease, unspecified whether esophagitis present C/o hoarseness of voice Nocturnal cough  Instructed to start omeprazole    4. Other fatigue Pt reports  fatigue since 2-3 weeks Will check labs  5. Stage 3b chronic kidney disease (HCC) Avoid nephrotoxic meds - CBC (no diff) - Basic Metabolic Panel

## 2024-02-13 ENCOUNTER — Ambulatory Visit: Payer: Self-pay | Admitting: Sports Medicine

## 2024-02-14 LAB — CBC
HCT: 31.4 % — ABNORMAL LOW (ref 38.5–50.0)
Hemoglobin: 10.5 g/dL — ABNORMAL LOW (ref 13.2–17.1)
MCH: 32.7 pg (ref 27.0–33.0)
MCHC: 33.4 g/dL (ref 32.0–36.0)
MCV: 97.8 fL (ref 80.0–100.0)
MPV: 9.7 fL (ref 7.5–12.5)
Platelets: 274 Thousand/uL (ref 140–400)
RBC: 3.21 Million/uL — ABNORMAL LOW (ref 4.20–5.80)
RDW: 12.4 % (ref 11.0–15.0)
WBC: 6.7 Thousand/uL (ref 3.8–10.8)

## 2024-02-14 LAB — BASIC METABOLIC PANEL WITH GFR
BUN/Creatinine Ratio: 17 (calc) (ref 6–22)
BUN: 37 mg/dL — ABNORMAL HIGH (ref 7–25)
CO2: 23 mmol/L (ref 20–32)
Calcium: 9.2 mg/dL (ref 8.6–10.3)
Chloride: 106 mmol/L (ref 98–110)
Creat: 2.14 mg/dL — ABNORMAL HIGH (ref 0.70–1.22)
Glucose, Bld: 131 mg/dL (ref 65–139)
Potassium: 4.1 mmol/L (ref 3.5–5.3)
Sodium: 139 mmol/L (ref 135–146)
eGFR: 29 mL/min/1.73m2 — ABNORMAL LOW (ref >=60)

## 2024-02-14 LAB — IRON,TIBC AND FERRITIN PANEL
%SAT: 33 % (ref 20–48)
Ferritin: 347 ng/mL (ref 24–380)
Iron: 78 ug/dL (ref 50–180)
TIBC: 238 ug/dL — ABNORMAL LOW (ref 250–425)

## 2024-02-14 LAB — TEST AUTHORIZATION

## 2024-03-03 ENCOUNTER — Ambulatory Visit: Admitting: Sports Medicine

## 2024-04-02 ENCOUNTER — Ambulatory Visit: Admitting: Sports Medicine

## 2024-04-02 ENCOUNTER — Encounter: Payer: Self-pay | Admitting: Sports Medicine

## 2024-04-02 VITALS — BP 128/69 | HR 72 | Temp 97.5°F | Ht 71.0 in | Wt 217.0 lb

## 2024-04-02 DIAGNOSIS — N184 Chronic kidney disease, stage 4 (severe): Secondary | ICD-10-CM

## 2024-04-02 DIAGNOSIS — R0982 Postnasal drip: Secondary | ICD-10-CM

## 2024-04-02 DIAGNOSIS — K219 Gastro-esophageal reflux disease without esophagitis: Secondary | ICD-10-CM | POA: Diagnosis not present

## 2024-04-02 DIAGNOSIS — N138 Other obstructive and reflux uropathy: Secondary | ICD-10-CM

## 2024-04-02 DIAGNOSIS — R2689 Other abnormalities of gait and mobility: Secondary | ICD-10-CM

## 2024-04-02 DIAGNOSIS — N401 Enlarged prostate with lower urinary tract symptoms: Secondary | ICD-10-CM

## 2024-04-02 DIAGNOSIS — I10 Essential (primary) hypertension: Secondary | ICD-10-CM | POA: Diagnosis not present

## 2024-04-02 MED ORDER — FLUTICASONE PROPIONATE 50 MCG/ACT NA SUSP: 2.0000 | Freq: Every day | NASAL | 6 refills | Status: AC

## 2024-04-02 MED ORDER — LORATADINE 10 MG PO TABS: 10.0000 mg | ORAL_TABLET | Freq: Every day | ORAL | 11 refills | Status: DC

## 2024-04-02 NOTE — Assessment & Plan Note (Addendum)
Pt denies urinary problems.

## 2024-04-02 NOTE — Assessment & Plan Note (Addendum)
 Bp at goal  Cont with ramipril  Avoid salty foods

## 2024-04-02 NOTE — Assessment & Plan Note (Addendum)
 Denies heart burn or acid reflux Avoid spicy foods

## 2024-04-02 NOTE — Assessment & Plan Note (Addendum)
 Avoid nephrotoxic meds Follow up with nephrology

## 2024-04-02 NOTE — Progress Notes (Signed)
 New Patient Office Visit  Patient ID: Mario Baxter, Male   DOB: 05/28/1930 89 y.o. MRN: [id number] Subjective:     Discussed the use of AI scribe software for clinical note transcription with the patient, who gave verbal consent to proceed.  History of Present Illness  Mario Baxter is a 89 year old male with a history of squamous cell carcinoma who presents to establish care at this new location   He has been diagnosed with squamous cell carcinoma following a biopsy and is scheduled for follow-up appointments in early January. He also has an upcoming appointment with a urologist.  He has a history of anemia for ten years and denies no recent hematuria or bloody stools.  He has been taking omeprazole , which has significantly improved his digestive symptoms and voice. He reports no current issues with urination, stating that his urine flow is 70% better compared to the last visit. He is no longer taking tamsulosin but continues with finasteride for prostate health.  He mentions a history of balance issues but has not experienced any falls since the last visit. He does not use a cane or walker but has increased his walking activity, recently walking over a mile. He notes that his leg muscles are not as strong as he used to be.  He reports a recent cold with a runny nose while in [place], but symptoms have since improved. He experiences daily phlegm in his throat but denies taking any allergy medications. No issues with breathing, chest pain, or sinus pain.  His appetite is improving, and he reports daily bowel movements after previously experiencing constipation. He attributes this improvement to dietary changes.  He notes occasional burning during urination if he waits too long to urinate, but this does not occur every time. He was previously treated with antibiotics for a urinary infection, which resolved the issue.  No mood issues, stating he is not depressed and remains active,  playing bridge regularly.   Outpatient Encounter Medications as of 04/02/2024  Medication Sig   finasteride (PROSCAR) 5 MG tablet Take 5 mg by mouth daily.   fluticasone (FLONASE) 50 MCG/ACT nasal spray Place 2 sprays into both nostrils daily.   loratadine (CLARITIN) 10 MG tablet Take 1 tablet (10 mg total) by mouth daily.   metroNIDAZOLE (METROGEL) 1 % gel Apply 1 Application topically 2 (two) times daily. Rosacea   Multiple Vitamins-Minerals (CENTRUM SILVER 50+MEN) TABS Take 1 tablet by mouth daily.   polyethylene glycol powder (GLYCOLAX /MIRALAX ) 17 GM/SCOOP powder Take 17 g by mouth every other day.   ramipril  (ALTACE ) 5 MG capsule Take 5 mg by mouth daily.   simvastatin  (ZOCOR ) 10 MG tablet Take 1 tablet (10 mg total) by mouth every evening.   [DISCONTINUED] amoxicillin (AMOXIL) 500 MG capsule Take 2,000 mg by mouth once. (Patient not taking: Reported on 04/02/2024)   No facility-administered encounter medications on file as of 04/02/2024.    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, mild stage    Hyperlipidemia    Hypertension    Pre-diabetes     Past Surgical History:  Procedure Laterality Date   BASAL CELL CARCINOMA EXCISION     CATARACT EXTRACTION, BILATERAL     COLONOSCOPY WITH PROPOFOL  N/A 09/11/2016   Procedure: COLONOSCOPY WITH PROPOFOL ;  Surgeon: Gaylyn,  Gladis PENNER, MD;  Location: [facility] ENDOSCOPY;  Service: Endoscopy;  Laterality: N/A;   ESOPHAGOGASTRODUODENOSCOPY (EGD) WITH PROPOFOL  N/A 09/11/2016   Procedure: ESOPHAGOGASTRODUODENOSCOPY (EGD) WITH PROPOFOL ;  Surgeon: Gaylyn Gladis PENNER, MD;  Location: [facility] ENDOSCOPY;  Service: Endoscopy;  Laterality: N/A;   EYE SURGERY Bilateral    JOINT REPLACEMENT Right 02/08/2014   hip   TRANSURETHRAL RESECTION OF PROSTATE N/A 12/31/2023   Procedure: TURP (TRANSURETHRAL RESECTION  OF PROSTATE);  Surgeon: Watt Rush, MD;  Location: [facility] ORS;  Service: Urology;  Laterality: N/A;    No family history on file.  Social History   Socioeconomic History   Marital status: Married    Spouse name: Not on file   Number of children: Not on file   Years of education: Not on file   Highest education level: Doctorate  Occupational History   Not on file  Tobacco Use   Smoking status: Never    Passive exposure: Never   Smokeless tobacco: Never  Vaping Use   Vaping status: Never Used  Substance and Sexual Activity   Alcohol use: Yes    Comment: WINE ONCE A DAY   Drug use: No   Sexual activity: Yes  Other Topics Concern   Not on file  Social History Narrative   Married two times   5 children 4 boys and one girl   [employer] for 24 years   Runner, 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    Social Drivers of Corporate Investment Banker Strain: Low Risk  (03/29/2024)   Overall Financial Resource Strain (CARDIA)    Difficulty of Paying Living Expenses: Not hard at all  Food Insecurity: No Food Insecurity (03/29/2024)   Hunger Vital Sign    Worried About Running Out of Food in the Last Year: Never true    Ran Out of Food in the Last Year: Never true  Transportation Needs: No Transportation Needs (03/29/2024)   PRAPARE - Administrator, Civil Service (Medical): No    Lack of Transportation (Non-Medical): No  Physical Activity: Insufficiently Active (03/29/2024)   Exercise Vital Sign    Days of Exercise per Week: 3 days    Minutes of Exercise per Session: 30 min  Stress: No Stress Concern Present (03/29/2024)   [employer] of Occupational Health - Occupational Stress Questionnaire    Feeling of Stress: Only a little  Social Connections: Socially Integrated (03/29/2024)   Social Connection and Isolation Panel    Frequency of Communication with Friends and Family: Once a week    Frequency of  Social Gatherings with Friends and Family: Twice a week    Attends Religious Services: More than 4 times per year    Active Member of [employer] or Organizations: Yes    Attends Banker Meetings: 1 to 4 times per year    Marital Status: Married  Catering Manager Violence: Not At Risk (12/31/2023)   Humiliation, Afraid, Rape, and Kick questionnaire    Fear of Current or Ex-Partner: No    Emotionally Abused: No    Physically Abused: No    Sexually Abused: No    Review of Systems  Constitutional:  Negative for chills and fever.  HENT:  Positive for congestion. Negative for ear pain and sore throat.   Respiratory:  Negative for cough, sputum production and shortness of breath.   Cardiovascular:  Negative for chest pain, palpitations and leg swelling.  Gastrointestinal:  Negative for abdominal pain, blood in stool, constipation, heartburn and nausea.  Genitourinary:  Negative for dysuria, frequency and hematuria.  Musculoskeletal:  Negative for falls and myalgias.  Neurological:  Negative for dizziness, sensory change and focal weakness.     Objective:    BP 128/69   Pulse 72   Temp (!) 97.5 F (36.4 C) (Oral)   Ht 5' 11 (1.803 m)   Wt 217 lb (98.4 kg)   SpO2 99%   BMI 30.27 kg/m   Physical Exam Constitutional:      Appearance: Normal appearance.  HENT:     Head: Normocephalic and atraumatic.     Right Ear: Tympanic membrane normal. There is no impacted cerumen.     Left Ear: There is no impacted cerumen.     Mouth/Throat:     Pharynx: No oropharyngeal exudate or posterior oropharyngeal erythema.  Eyes:     Conjunctiva/sclera: Conjunctivae normal.  Cardiovascular:     Rate and Rhythm: Normal rate and regular rhythm.     Pulses: Normal pulses.     Heart sounds: Normal heart sounds.  Pulmonary:     Effort: No respiratory distress.     Breath sounds: No stridor. No wheezing or rales.  Abdominal:     General: Bowel sounds are normal. There is no distension.      Palpations: Abdomen is soft.     Tenderness: There is no abdominal tenderness. There is no guarding.  Musculoskeletal:        General: No swelling.  Lymphadenopathy:     Cervical: No cervical adenopathy.  Neurological:     Mental Status: He is alert. Mental status is at baseline.     Motor: No weakness.     Last CBC Lab Results  Component Value Date   WBC 6.7 02/12/2024   HGB 10.5 (L) 02/12/2024   HCT 31.4 (L) 02/12/2024   MCV 97.8 02/12/2024   MCH 32.7 02/12/2024   RDW 12.4 02/12/2024   PLT 274 02/12/2024   Last metabolic panel Lab Results  Component Value Date   GLUCOSE 131 02/12/2024   NA 139 02/12/2024   K 4.1 02/12/2024   CL 106 02/12/2024   CO2 23 02/12/2024   BUN 37 (H) 02/12/2024   CREATININE 2.14 (H) 02/12/2024   EGFR 29 (L) 02/12/2024   CALCIUM 9.2 02/12/2024   PROT 6.5 08/15/2023   ALBUMIN 4.2 08/15/2023   LABGLOB 2.3 08/15/2023   AGRATIO 2.5 (H) 07/09/2022   BILITOT 0.3 08/15/2023   ALKPHOS 90 08/15/2023   AST 15 08/15/2023   ALT 11 08/15/2023   ANIONGAP 8 02/15/2014   Last lipids Lab Results  Component Value Date   CHOL 140 08/15/2023   HDL 41 08/15/2023   LDLCALC 81 08/15/2023   TRIG 93 08/15/2023   CHOLHDL 3.4 08/15/2023   Last hemoglobin A1c Lab Results  Component Value Date   HGBA1C 5.7 (H) 01/18/2023   Last thyroid functions No results found for: TSH, T3TOTAL, T4TOTAL, FREET4, THYROIDAB Last vitamin D No results found for: 25OHVITD2, 25OHVITD3, VD25OH Last vitamin B12 and Folate No results found for: VITAMINB12, FOLATE     Assessment & Plan:   Assessment & Plan Essential hypertension Bp at goal  Cont with ramipril  Avoid salty foods    GERD without esophagitis Denies heart burn or acid reflux Avoid spicy foods    CKD (chronic kidney disease) stage 4, GFR 15-29 ml/min (HCC) Avoid nephrotoxic meds Follow up with nephrology    BPH  with urinary obstruction Pt denies urinary problems    Balance  problem Will send the script for walker Orders:   Ambulatory referral to Physical Therapy  Post-nasal drip No sinus tenderness Orders:   fluticasone (FLONASE) 50 MCG/ACT nasal spray; Place 2 sprays into both nostrils daily.   loratadine (CLARITIN) 10 MG tablet; Take 1 tablet (10 mg total) by mouth daily.   Return in about 3 months (around 07/01/2024).   Jackalyn Blazing, MD [facility]  40 min Total time spent for obtaining history,  performing a medically appropriate examination and evaluation, reviewing the tests,ordering  tests,  documenting clinical information in the electronic or other health record, independently interpreting results ,care coordination (not separately reported)

## 2024-04-02 NOTE — Patient Instructions (Signed)

## 2024-04-03 ENCOUNTER — Telehealth: Payer: Self-pay

## 2024-04-03 NOTE — Telephone Encounter (Signed)
 Copied from CRM #[id number]. Topic: Clinical - Order For Equipment >> Apr 02, 2024  5:17 PM Alfonso ORN wrote: Reason for CRM: [facility] called to f/u on order received for walker and is requesting : qualifying diagnosis and need for walker   Callback : [phone] , fax: [phone] >> Apr 03, 2024 12:21 PM Suzen RAMAN wrote: Patient called to inform provider that his wife has a walker that he will use so the order for a new walker can be disregarded.  I spoke with [employer] and they said the diagnosis that was given does not qualify him for a walker. Pt no longer needs walker so I will let [employer] know order can be cancelled

## 2024-05-06 ENCOUNTER — Inpatient Hospital Stay

## 2024-05-06 ENCOUNTER — Encounter: Payer: Self-pay | Admitting: Oncology

## 2024-05-06 ENCOUNTER — Inpatient Hospital Stay: Attending: Oncology | Admitting: Oncology

## 2024-05-06 VITALS — BP 128/57 | HR 63 | Temp 98.3°F | Resp 17 | Ht 71.0 in | Wt 210.0 lb

## 2024-05-06 DIAGNOSIS — N1832 Chronic kidney disease, stage 3b: Secondary | ICD-10-CM | POA: Diagnosis not present

## 2024-05-06 DIAGNOSIS — D631 Anemia in chronic kidney disease: Secondary | ICD-10-CM | POA: Insufficient documentation

## 2024-05-06 DIAGNOSIS — D649 Anemia, unspecified: Secondary | ICD-10-CM

## 2024-05-06 DIAGNOSIS — I129 Hypertensive chronic kidney disease with stage 1 through stage 4 chronic kidney disease, or unspecified chronic kidney disease: Secondary | ICD-10-CM | POA: Diagnosis not present

## 2024-05-06 DIAGNOSIS — E785 Hyperlipidemia, unspecified: Secondary | ICD-10-CM | POA: Diagnosis not present

## 2024-05-06 LAB — CMP (CANCER CENTER ONLY)
ALT: 27 U/L (ref 0–44)
AST: 26 U/L (ref 15–41)
Albumin: 3.9 g/dL (ref 3.5–5.0)
Alkaline Phosphatase: 97 U/L (ref 38–126)
Anion gap: 11 (ref 5–15)
BUN: 34 mg/dL — ABNORMAL HIGH (ref 8–23)
CO2: 22 mmol/L (ref 22–32)
Calcium: 9.4 mg/dL (ref 8.9–10.3)
Chloride: 104 mmol/L (ref 98–111)
Creatinine: 2.25 mg/dL — ABNORMAL HIGH (ref 0.61–1.24)
GFR, Estimated: 27 mL/min — ABNORMAL LOW
Glucose, Bld: 99 mg/dL (ref 70–99)
Potassium: 4.3 mmol/L (ref 3.5–5.1)
Sodium: 137 mmol/L (ref 135–145)
Total Bilirubin: 0.2 mg/dL (ref 0.0–1.2)
Total Protein: 7.3 g/dL (ref 6.5–8.1)

## 2024-05-06 LAB — CBC WITH DIFFERENTIAL (CANCER CENTER ONLY)
Abs Immature Granulocytes: 0.03 K/uL (ref 0.00–0.07)
Basophils Absolute: 0 K/uL (ref 0.0–0.1)
Basophils Relative: 0 %
Eosinophils Absolute: 0.3 K/uL (ref 0.0–0.5)
Eosinophils Relative: 4 %
HCT: 32.7 % — ABNORMAL LOW (ref 39.0–52.0)
Hemoglobin: 11.1 g/dL — ABNORMAL LOW (ref 13.0–17.0)
Immature Granulocytes: 0 %
Lymphocytes Relative: 26 %
Lymphs Abs: 1.9 K/uL (ref 0.7–4.0)
MCH: 32.4 pg (ref 26.0–34.0)
MCHC: 33.9 g/dL (ref 30.0–36.0)
MCV: 95.3 fL (ref 80.0–100.0)
Monocytes Absolute: 0.5 K/uL (ref 0.1–1.0)
Monocytes Relative: 7 %
Neutro Abs: 4.7 K/uL (ref 1.7–7.7)
Neutrophils Relative %: 63 %
Platelet Count: 367 K/uL (ref 150–400)
RBC: 3.43 MIL/uL — ABNORMAL LOW (ref 4.22–5.81)
RDW: 13.7 % (ref 11.5–15.5)
WBC Count: 7.5 K/uL (ref 4.0–10.5)
nRBC: 0 % (ref 0.0–0.2)

## 2024-05-06 LAB — VITAMIN B12: Vitamin B-12: 669 pg/mL (ref 180–914)

## 2024-05-06 LAB — FERRITIN: Ferritin: 718 ng/mL — ABNORMAL HIGH (ref 24–336)

## 2024-05-06 LAB — IRON AND IRON BINDING CAPACITY (CC-WL,HP ONLY)
Iron: 58 ug/dL (ref 45–182)
Saturation Ratios: 25 % (ref 17.9–39.5)
TIBC: 230 ug/dL — ABNORMAL LOW (ref 250–450)
UIBC: 172 ug/dL

## 2024-05-06 LAB — DIRECT ANTIGLOBULIN TEST (NOT AT ARMC)
DAT, IgG: NEGATIVE
DAT, complement: NEGATIVE

## 2024-05-06 LAB — FOLATE: Folate: 19 ng/mL

## 2024-05-06 LAB — LACTATE DEHYDROGENASE: LDH: 182 U/L (ref 105–235)

## 2024-05-06 LAB — TSH: TSH: 3.94 u[IU]/mL (ref 0.350–4.500)

## 2024-05-06 NOTE — Assessment & Plan Note (Signed)
 Chronic, mild anemia with hemoglobin currently 10.5 g/dL (previously 87.2 g/dL in 7975), longstanding since at least 2015.   Likely multifactorial, with chronic kidney disease contributing via decreased erythropoietin production.   No evidence of ongoing blood loss or significant symptoms, except for rare dyspnea and fatigue.   Anemia remains stable and not severe enough to require immediate intervention.   ESA therapy is not indicated unless hemoglobin falls below 9 g/dL, as benefit is limited at higher levels.  Labs today actually showed slightly better hemoglobin at 11.5, MCV normal at 95.  White count and platelet count remain within normal limits.  Iron studies showed no evidence of iron deficiency.  Vitamin B12, folic acid, TSH, LDH are all within normal limits.  Coombs test negative.  In the context of CKD, we will rule out monoclonal gammopathy and hence we pursued SPEP, IFE, quantitative immunoglobulins, serum free light chains.  Suspicion is low for monoclonal gammopathy at this time.  - Planned to call in two weeks to review laboratory results and determine further management. - Discussed that erythropoietin injections are not indicated unless hemoglobin is <9 g/dL. - Scheduled follow-up in approximately three months, with earlier review if laboratory results or symptoms warrant.

## 2024-05-06 NOTE — Progress Notes (Signed)
 "   [facility]  HEMATOLOGY CLINIC CONSULTATION NOTE   PATIENT NAME: Mario Baxter   MR#: [id number] DOB: 21-Jun-1930  DATE OF SERVICE: 05/06/2024   REFERRING PROVIDER  Woodward Brought, MD, Nephrology  Patient Care Team: Sherlynn Madden, MD as PCP - General (Internal Medicine) Brought Woodward, MD as Consulting Physician (Nephrology)   REASON FOR CONSULTATION/ CHIEF COMPLAINT: Normocytic anemia for further evaluation  ASSESSMENT & PLAN:  Mario Baxter is a 89 y.o. gentleman with a past medical history of hypertension, dyslipidemia, stage 3 b CKD, GERD, glaucoma, was referred to our service for evaluation of normocytic anemia.    Normocytic anemia Chronic, mild anemia with hemoglobin currently 10.5 g/dL (previously 87.2 g/dL in 7975), longstanding since at least 2015.   Likely multifactorial, with chronic kidney disease contributing via decreased erythropoietin production.   No evidence of ongoing blood loss or significant symptoms, except for rare dyspnea and fatigue.   Anemia remains stable and not severe enough to require immediate intervention.   ESA therapy is not indicated unless hemoglobin falls below 9 g/dL, as benefit is limited at higher levels.  Labs today actually showed slightly better hemoglobin at 11.5, MCV normal at 95.  White count and platelet count remain within normal limits.  Iron studies showed no evidence of iron deficiency.  Vitamin B12, folic acid, TSH, LDH are all within normal limits.  Coombs test negative.  In the context of CKD, we will rule out monoclonal gammopathy and hence we pursued SPEP, IFE, quantitative immunoglobulins, serum free light chains.  Suspicion is low for monoclonal gammopathy at this time.  - Planned to call in two weeks to review laboratory results and determine further management. - Discussed that erythropoietin injections are not indicated unless hemoglobin is <9 g/dL. - Scheduled follow-up in approximately  three months, with earlier review if laboratory results or symptoms warrant.  Chronic kidney disease, Stage 3  Chronic kidney disease for over one year, contributing to anemia via decreased erythropoietin production. No acute decompensation identified during this encounter. - Reviewed the relationship between renal dysfunction and anemia with him and his family. - Will assess for other etiologies of anemia prior to attributing it solely to chronic kidney disease. - Coordinated care with nephrology.  I reviewed lab results and outside records for this visit and discussed relevant results with the patient. Diagnosis, plan of care and treatment options were also discussed in detail with the patient. Opportunity provided to ask questions and answers provided to his apparent satisfaction. Provided instructions to call our clinic with any problems, questions or concerns prior to return visit. I recommended to continue follow-up with PCP and sub-specialists. He verbalized understanding and agreed with the plan. No barriers to learning was detected.  Chinita Patten, MD  05/06/2024 5:50 PM  [facility] - A DEPT OF [facility] [address] [place] [address] Dept: [phone] Dept Fax: [phone]   HISTORY OF PRESENT ILLNESS:  Discussed the use of AI scribe software for clinical note transcription with the patient, who gave verbal consent to proceed.  History of Present Illness Mario Baxter is a 89 year old male with chronic kidney disease and chronic anemia who presents for evaluation of persistent anemia.  He has chronic kidney disease for over a year, with persistent anemia documented on laboratory studies for at least the past year. Hemoglobin has declined from 12.7 g/dL in early 7975 to 10.5  g/dL more recently. Prior anemia was noted in 2015 during the perioperative period for hip replacement. There is no evidence of ongoing  blood loss, including absence of melena, hematochezia, epistaxis, or gingival bleeding. He experienced transient hematuria following prostate surgery in September, which resolved within two months.  He describes variable energy levels, with a notable episode of significant dyspnea in August requiring him to stop and support himself while ambulating, and a recent episode of severe fatigue necessitating sitting while buttoning his shirt. Otherwise, he denies recurrent dyspnea, dizziness (except for the August episode), chest pain, or palpitations. He remains ambulatory, able to walk 20-30 minutes at a time, and walked over a mile in November and December, though he has not walked for two weeks due to cold weather. Appetite is decreased compared to baseline, and he sometimes does not finish meals, but there is no significant weight loss.  He has experienced digestive changes over the past six months, which he and his family attribute in part to antibiotic use. He does not consume yogurt or probiotics. There is no gastrointestinal bleeding or melena.  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, mild stage    Hyperlipidemia    Hypertension    Pre-diabetes      SURGICAL HISTORY Past Surgical History:  Procedure Laterality Date   BASAL CELL CARCINOMA EXCISION     CATARACT EXTRACTION, BILATERAL     COLONOSCOPY WITH PROPOFOL  N/A 09/11/2016   Procedure: COLONOSCOPY WITH PROPOFOL ;  Surgeon: Gaylyn Gladis PENNER, MD;  Location: [facility] ENDOSCOPY;  Service: Endoscopy;  Laterality: N/A;   ESOPHAGOGASTRODUODENOSCOPY (EGD) WITH PROPOFOL  N/A 09/11/2016   Procedure: ESOPHAGOGASTRODUODENOSCOPY  (EGD) WITH PROPOFOL ;  Surgeon: Gaylyn Gladis PENNER, MD;  Location: [facility] ENDOSCOPY;  Service: Endoscopy;  Laterality: N/A;   EYE SURGERY Bilateral    JOINT REPLACEMENT Right 02/08/2014   hip   TRANSURETHRAL RESECTION OF PROSTATE N/A 12/31/2023   Procedure: TURP (TRANSURETHRAL RESECTION OF PROSTATE);  Surgeon: Watt Rush, MD;  Location: [facility];  Service: Urology;  Laterality: N/A;     SOCIAL HISTORY: He reports that he has never smoked. He has never been exposed to tobacco smoke. He has never used smokeless tobacco. He reports current alcohol use. He reports that he does not use drugs. Social History   Socioeconomic History   Marital status: Married    Spouse name: Not on file   Number of children: Not on file   Years of education: Not on file   Highest education level: Doctorate  Occupational History   Not on file  Tobacco Use   Smoking status: Never    Passive exposure: Never   Smokeless tobacco: Never  Vaping Use   Vaping status: Never Used  Substance and Sexual Activity   Alcohol use: Yes    Comment: WINE ONCE A DAY   Drug use: No   Sexual activity: Yes  Other Topics Concern   Not on file  Social History Narrative   Married two times   5 children 4 boys and one girl   [employer] for 24 years   Runner, 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    Social Drivers of Health   Tobacco Use: Low Risk (04/02/2024)   Patient History    Smoking Tobacco Use: Never    Smokeless Tobacco Use: Never    Passive Exposure: Never  Financial Resource Strain: Low Risk (03/29/2024)   Overall Financial Resource Strain (CARDIA)    Difficulty of Paying Living Expenses: Not hard at all  Food Insecurity: No Food Insecurity (05/06/2024)   ACO Reach    Worried About Running Out of Food in the Last Year: No    Ran Out of Food in the Last Year: No  Transportation Needs: No Transportation Needs (05/06/2024)   ACO Reach    Lack of Transportation: No   Physical Activity: Insufficiently Active (03/29/2024)   Exercise Vital Sign    Days of Exercise per Week: 3 days    Minutes of Exercise per Session: 30 min  Stress: No Stress Concern Present (03/29/2024)   [employer] of Occupational Health - Occupational Stress Questionnaire    Feeling of Stress: Only a little  Social Connections: Socially Integrated (03/29/2024)   Social Connection and Isolation Panel    Frequency of Communication with Friends and Family: Once a week    Frequency of Social Gatherings with Friends and Family: Twice a week    Attends Religious Services: More than 4 times per year    Active Member of [employer] or Organizations: Yes    Attends Banker Meetings: 1 to 4 times per year    Marital Status: Married  Catering Manager Violence: Not At Risk (05/06/2024)   ACO Reach    Feels Physically and Emotionally Safe: Yes    Physically Hurt by Someone: No    Humiliated or Emotionally Abused by Someone: No  Depression (PHQ2-9): Low Risk (05/06/2024)   Depression (PHQ2-9)    PHQ-2 Score: 0  Alcohol Screen: Low Risk (03/29/2024)   Alcohol Screen    Last Alcohol Screening Score (AUDIT): 3  Housing: At Risk (05/06/2024)   ACO Reach    Has Housing: No    Worried About Losing Housing: No    Unable to Get Utilities: No  Utilities: At Risk (05/06/2024)   ACO Reach    Has Housing: No    Worried About Losing Housing: No    Unable to Get Utilities: No  Health Literacy: Not on file    FAMILY HISTORY: His family history is not on file.  CURRENT MEDICATIONS   Current Outpatient Medications  Medication Instructions   cephALEXin (KEFLEX) 500 mg, 2 times daily   finasteride (PROSCAR) 5 mg, Daily   fluticasone  (FLONASE ) 50 MCG/ACT nasal spray 2 sprays, Each Nare, Daily   metroNIDAZOLE (METROGEL) 1 % gel 1 Application, 2 times daily   Multiple Vitamins-Minerals (CENTRUM SILVER 50+MEN) TABS 1 tablet, Daily   ramipril  (ALTACE ) 5 mg, Daily   simvastatin  (ZOCOR ) 10 mg,  Oral, Every evening   tamsulosin (FLOMAX) 0.4 mg, Daily after breakfast     ALLERGIES  He has no known allergies.  REVIEW OF SYSTEMS:  Review of Systems - Oncology   Rest of the pertinent review of systems is unremarkable except as mentioned above in HPI.  PHYSICAL EXAMINATION:    Onc Performance Status - 05/06/24 1424       ECOG Perf Status   ECOG Perf Status Ambulatory and capable of all selfcare but unable to carry out any work activities.  Up and about more than 50% of waking  hours      KPS SCALE   KPS % SCORE Cares for self, unable to carry on normal activity or to do active work          Vitals:   05/06/24 1409  BP: (!) 128/57  Pulse: 63  Resp: 17  Temp: 98.3 F (36.8 C)  SpO2: 100%   Filed Weights   05/06/24 1409  Weight: 210 lb (95.3 kg)    Physical Exam Constitutional:      General: He is not in acute distress.    Appearance: Normal appearance.  HENT:     Head: Normocephalic and atraumatic.  Eyes:     Conjunctiva/sclera: Conjunctivae normal.  Cardiovascular:     Rate and Rhythm: Normal rate and regular rhythm.     Heart sounds: Normal heart sounds.  Pulmonary:     Effort: Pulmonary effort is normal. No respiratory distress.     Breath sounds: Normal breath sounds.  Abdominal:     General: There is no distension.  Neurological:     General: No focal deficit present.     Mental Status: He is alert and oriented to person, place, and time.  Psychiatric:        Mood and Affect: Mood normal.        Behavior: Behavior normal.      LABORATORY DATA:   I have reviewed the data as listed.  Results for orders placed or performed in visit on 05/06/24  TSH  Result Value Ref Range   TSH 3.940 0.350 - 4.500 uIU/mL  Folate  Result Value Ref Range   Folate 19.0 >5.9 ng/mL  Vitamin B12  Result Value Ref Range   Vitamin B-12 669 180 - 914 pg/mL  Ferritin  Result Value Ref Range   Ferritin 718 (H) 24 - 336 ng/mL  Iron and Iron Binding Capacity  ([facility] only)  Result Value Ref Range   Iron 58 45 - 182 ug/dL   TIBC 769 (L) 749 - 549 ug/dL   Saturation Ratios 25 17.9 - 39.5 %   UIBC 172 ug/dL  Lactate dehydrogenase  Result Value Ref Range   LDH 182 105 - 235 U/L  CMP (Cancer Center only)  Result Value Ref Range   Sodium 137 135 - 145 mmol/L   Potassium 4.3 3.5 - 5.1 mmol/L   Chloride 104 98 - 111 mmol/L   CO2 22 22 - 32 mmol/L   Glucose, Bld 99 70 - 99 mg/dL   BUN 34 (H) 8 - 23 mg/dL   Creatinine 7.74 (H) 9.38 - 1.24 mg/dL   Calcium 9.4 8.9 - 89.6 mg/dL   Total Protein 7.3 6.5 - 8.1 g/dL   Albumin 3.9 3.5 - 5.0 g/dL   AST 26 15 - 41 U/L   ALT 27 0 - 44 U/L   Alkaline Phosphatase 97 38 - 126 U/L   Total Bilirubin 0.2 0.0 - 1.2 mg/dL   GFR, Estimated 27 (L) >60 mL/min   Anion gap 11 5 - 15  CBC with Differential (Cancer Center Only)  Result Value Ref Range   WBC Count 7.5 4.0 - 10.5 K/uL   RBC 3.43 (L) 4.22 - 5.81 MIL/uL   Hemoglobin 11.1 (L) 13.0 - 17.0 g/dL   HCT 67.2 (L) 60.9 - 47.9 %   MCV 95.3 80.0 - 100.0 fL   MCH 32.4 26.0 - 34.0 pg   MCHC 33.9 30.0 - 36.0 g/dL   RDW 86.2 88.4 - 84.4 %  Platelet Count 367 150 - 400 K/uL   nRBC 0.0 0.0 - 0.2 %   Neutrophils Relative % 63 %   Neutro Abs 4.7 1.7 - 7.7 K/uL   Lymphocytes Relative 26 %   Lymphs Abs 1.9 0.7 - 4.0 K/uL   Monocytes Relative 7 %   Monocytes Absolute 0.5 0.1 - 1.0 K/uL   Eosinophils Relative 4 %   Eosinophils Absolute 0.3 0.0 - 0.5 K/uL   Basophils Relative 0 %   Basophils Absolute 0.0 0.0 - 0.1 K/uL   Immature Granulocytes 0 %   Abs Immature Granulocytes 0.03 0.00 - 0.07 K/uL  Direct antiglobulin test (not at [facility])  Result Value Ref Range   DAT, complement NEG    DAT, IgG      NEG Performed at [facility], [address], [place], [place] [address]      RADIOGRAPHIC STUDIES:  No pertinent imaging studies available to review.  Orders Placed This Encounter  Procedures   CBC with Differential (Cancer Center  Only)    Standing Status:   Future    Number of Occurrences:   1    Expiration Date:   05/06/2025   CMP (Cancer Center only)    Standing Status:   Future    Number of Occurrences:   1    Expiration Date:   05/06/2025   Lactate dehydrogenase    Standing Status:   Future    Number of Occurrences:   1    Expiration Date:   05/06/2025   Iron and Iron Binding Capacity ([facility] only)    Standing Status:   Future    Number of Occurrences:   1    Expiration Date:   05/06/2025   Ferritin    Standing Status:   Future    Number of Occurrences:   1    Expiration Date:   05/06/2025   Vitamin B12    Standing Status:   Future    Number of Occurrences:   1    Expiration Date:   05/06/2025   Folate    Standing Status:   Future    Number of Occurrences:   1    Expiration Date:   05/06/2025   TSH    Standing Status:   Future    Number of Occurrences:   1    Expiration Date:   05/06/2025   Multiple Myeloma Panel (SPEP&IFE w/QIG)    Standing Status:   Future    Number of Occurrences:   1    Expiration Date:   05/06/2025   Kappa/lambda light chains    Standing Status:   Future    Number of Occurrences:   1    Expiration Date:   05/06/2025   Haptoglobin    Standing Status:   Future    Number of Occurrences:   1    Expiration Date:   05/06/2025   Direct antiglobulin test (not at [facility])    Standing Status:   Future    Number of Occurrences:   1    Expiration Date:   05/06/2025    Future Appointments  Date Time Provider Department Center  07/01/2024  9:20 AM Sherlynn Madden, MD [id number] [id number]    I spent a total of 55 minutes during this encounter with the patient including review of chart and various tests results, discussions about plan of care and coordination of care plan.  This document was completed utilizing speech recognition software.  Grammatical errors, random word insertions, pronoun errors, and incomplete sentences are an occasional consequence of this system due to software limitations,  ambient noise, and hardware issues. Any formal questions or concerns about the content, text or information contained within the body of this dictation should be directly addressed to the provider for clarification.  "

## 2024-05-07 LAB — KAPPA/LAMBDA LIGHT CHAINS
Kappa free light chain: 37.7 mg/L — ABNORMAL HIGH (ref 3.3–19.4)
Kappa, lambda light chain ratio: 1.45 (ref 0.26–1.65)
Lambda free light chains: 26 mg/L (ref 5.7–26.3)

## 2024-05-07 LAB — HAPTOGLOBIN: Haptoglobin: 374 mg/dL — ABNORMAL HIGH (ref 38–329)

## 2024-05-08 ENCOUNTER — Telehealth: Payer: Self-pay | Admitting: Oncology

## 2024-05-08 LAB — MULTIPLE MYELOMA PANEL, SERUM
Albumin SerPl Elph-Mcnc: 3.3 g/dL (ref 2.9–4.4)
Albumin/Glob SerPl: 1.1 (ref 0.7–1.7)
Alpha 1: 0.4 g/dL (ref 0.0–0.4)
Alpha2 Glob SerPl Elph-Mcnc: 1 g/dL (ref 0.4–1.0)
B-Globulin SerPl Elph-Mcnc: 1.1 g/dL (ref 0.7–1.3)
Gamma Glob SerPl Elph-Mcnc: 0.7 g/dL (ref 0.4–1.8)
Globulin, Total: 3.2 g/dL (ref 2.2–3.9)
IgA: 191 mg/dL (ref 61–437)
IgG (Immunoglobin G), Serum: 964 mg/dL (ref 603–1613)
IgM (Immunoglobulin M), Srm: 80 mg/dL (ref 15–143)
Total Protein ELP: 6.5 g/dL (ref 6.0–8.5)

## 2024-05-08 NOTE — Telephone Encounter (Signed)
 Patient has been scheduled for follow-up visit per 05/07/2024 LOS.  LVM notifying pt of appt details, provided my direct number to pt if appt changes need to be made.

## 2024-05-18 ENCOUNTER — Other Ambulatory Visit: Payer: Self-pay | Admitting: Sports Medicine

## 2024-05-18 DIAGNOSIS — E782 Mixed hyperlipidemia: Secondary | ICD-10-CM

## 2024-05-19 ENCOUNTER — Inpatient Hospital Stay: Admitting: Oncology

## 2024-05-19 DIAGNOSIS — N1832 Chronic kidney disease, stage 3b: Secondary | ICD-10-CM | POA: Diagnosis not present

## 2024-05-19 DIAGNOSIS — D638 Anemia in other chronic diseases classified elsewhere: Secondary | ICD-10-CM

## 2024-05-19 DIAGNOSIS — D631 Anemia in chronic kidney disease: Secondary | ICD-10-CM | POA: Diagnosis not present

## 2024-05-19 NOTE — Progress Notes (Signed)
 "  [facility]  HEMATOLOGY-ONCOLOGY ELECTRONIC VISIT PROGRESS NOTE  PATIENT NAME: Mario Baxter   MR#: [id number] DOB: August 07, 1930  DATE OF SERVICE: 05/19/2024  Patient Care Team: Sherlynn Madden, MD as PCP - General (Internal Medicine) Douglas Rule, MD as Consulting Physician (Nephrology)  I connected with the patient via telephone conference and verified that I am speaking with the correct person using two identifiers. The patient's location is at home and I am providing care from the [facility]  I discussed the limitations, risks, security and privacy concerns of performing an evaluation and management service by e-visits and the availability of in person appointments. I also discussed with the patient that there may be a patient responsible charge related to this service. The patient expressed understanding and agreed to proceed.   ASSESSMENT & PLAN:   Mario Baxter is a 89 y.o. gentleman with a past medical history of hypertension, dyslipidemia, stage 3 b CKD, GERD, glaucoma, was referred to our service in January 2026 for evaluation of normocytic anemia.  Grossly negative hematological workup.  Picture consistent with anemia of chronic disease, related to CKD.  Anemia in chronic kidney disease (CKD) Chronic, mild anemia with hemoglobin currently 10.5 g/dL (previously 87.2 g/dL in 7975), longstanding since at least 2015.   Likely multifactorial, with chronic kidney disease contributing via decreased erythropoietin production.   No evidence of ongoing blood loss or significant symptoms, except for rare dyspnea and fatigue.   Anemia remains stable and not severe enough to require immediate intervention.   ESA therapy is not indicated unless hemoglobin falls below 9 g/dL, as benefit is limited at higher levels.  On his consultation with us  on 05/06/2024, labs actually showed slightly better hemoglobin at 11.5, MCV normal at 95.  White count and platelet count  remain within normal limits.  Iron studies showed no evidence of iron deficiency.  Vitamin B12, folic acid , TSH, LDH were all within normal limits.  Coombs test negative.   In the context of CKD, it was important to rule out monoclonal gammopathy and hence we pursued SPEP, IFE, quantitative immunoglobulins, serum free light chains and it was unremarkable without evidence of M spike.  Serum free kappa was slightly increased at 37.1 mg/L, lambda was normal at 26, ratio normal at 1.45.  Overall evidence of monoclonal gammopathy   - Discussed that erythropoietin injections are not indicated unless hemoglobin is <9 g/dL.  - Scheduled follow-up in approximately three months, with earlier review if laboratory results or symptoms warrant.     I discussed the assessment and treatment plan with the patient. The patient was provided an opportunity to ask questions and all were answered. The patient agreed with the plan and demonstrated an understanding of the instructions. The patient was advised to call back or seek an in-person evaluation if the symptoms worsen or if the condition fails to improve as anticipated.    I spent 12 minutes over the phone with the patient reviewing test results, discuss management and coordination/planning of care.  Chinita Patten, MD 05/19/2024 4:04 PM [facility] - A DEPT OF [facility] [address] [place] [address] Dept: [phone] Dept Fax: [phone]   INTERVAL HISTORY:  Please see above for problem oriented charting.  The purpose of today's discussion is to explain recent lab results and to formulate plan of care.  Discussed the use of AI scribe software for clinical note transcription with the  patient, who gave verbal consent to proceed.  History of Present Illness Mario Baxter is a 89 year old male with chronic kidney disease and anemia who presents for hematology evaluation  of anemia etiology and management.  He was referred by his nephrologist for evaluation of anemia. Previous hemoglobin was 10.5 g/dL, with improvement to 88.8 g/dL on repeat testing two weeks ago. White blood cell and platelet counts were within normal limits. He denies new or concerning symptoms and reports feeling well.  Comprehensive laboratory evaluation was unremarkable, including normal iron studies, vitamin B12, folic acid , and thyroid  function. Autoimmune testing for hemolysis was negative, and there was no evidence of bone marrow pathology. Renal function remains stable with a creatinine of 2.25 mg/dL.    SUMMARY OF HEMATOLOGY HISTORY:  89 y.o. gentleman with chronic kidney disease and chronic anemia who presents for evaluation of persistent anemia.   He has chronic kidney disease for over a year, with persistent anemia documented on laboratory studies for at least the past year. Hemoglobin has declined from 12.7 g/dL in early 7975 to 89.4 g/dL more recently. Prior anemia was noted in 2015 during the perioperative period for hip replacement. There is no evidence of ongoing blood loss, including absence of melena, hematochezia, epistaxis, or gingival bleeding. He experienced transient hematuria following prostate surgery in September, which resolved within two months.   He describes variable energy levels, with a notable episode of significant dyspnea in August requiring him to stop and support himself while ambulating, and a recent episode of severe fatigue necessitating sitting while buttoning his shirt. Otherwise, he denies recurrent dyspnea, dizziness (except for the August episode), chest pain, or palpitations. He remains ambulatory, able to walk 20-30 minutes at a time, and walked over a mile in November and December, though he has not walked for two weeks due to cold weather. Appetite is decreased compared to baseline, and he sometimes does not finish meals, but there is no significant  weight loss.   He has experienced digestive changes over the past six months, which he and his family attribute in part to antibiotic use. He does not consume yogurt or probiotics. There is no gastrointestinal bleeding or melena.   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  Chronic, mild anemia with hemoglobin currently 10.5 g/dL (previously 87.2 g/dL in 7975), longstanding since at least 2015.    Likely multifactorial, with chronic kidney disease contributing via decreased erythropoietin production.    No evidence of ongoing blood loss or significant symptoms, except for rare dyspnea and fatigue.    Anemia remains stable and not severe enough to require immediate intervention.    ESA therapy is not indicated unless hemoglobin falls below 9 g/dL, as benefit is limited at higher levels.   On his consultation with us  on 05/06/2024, labs actually showed slightly better hemoglobin at 11.5, MCV normal at 95.  White count and platelet count remain within normal limits.  Iron studies showed no evidence of iron deficiency.  Vitamin B12, folic acid , TSH, LDH were all within normal limits.  Coombs test negative.   In the context of CKD, it was important to rule out monoclonal gammopathy and hence we pursued SPEP, IFE, quantitative immunoglobulins, serum free light chains and it was unremarkable without evidence of M spike.  Serum free kappa was slightly increased at 37.1 mg/L, lambda was normal at 26, ratio normal at 1.45.  Overall evidence of monoclonal gammopathy   - Discussed that erythropoietin injections are not indicated unless hemoglobin is <9 g/dL.   REVIEW OF SYSTEMS:    Review of Systems - Oncology  All other pertinent systems were reviewed with the patient and are negative.  I have reviewed the past medical history, past surgical history, social history and family history with the  patient and they are unchanged from previous note.  ALLERGIES:  He has no known allergies.  MEDICATIONS:  Current Outpatient Medications  Medication Sig Dispense Refill   cephALEXin (KEFLEX) 500 MG capsule Take 500 mg by mouth 2 (two) times daily.     finasteride (PROSCAR) 5 MG tablet Take 5 mg by mouth daily.     fluticasone  (FLONASE ) 50 MCG/ACT nasal spray Place 2 sprays into both nostrils daily. 16 g 6   metroNIDAZOLE (METROGEL) 1 % gel Apply 1 Application topically 2 (two) times daily. Rosacea     Multiple Vitamins-Minerals (CENTRUM SILVER 50+MEN) TABS Take 1 tablet by mouth daily.     ramipril  (ALTACE ) 5 MG capsule Take 5 mg by mouth daily.     simvastatin  (ZOCOR ) 10 MG tablet Take 1 tablet (10 mg total) by mouth every evening. 90 tablet 0   tamsulosin (FLOMAX) 0.4 MG CAPS capsule Take 0.4 mg by mouth daily after breakfast.     No current facility-administered medications for this visit.    PHYSICAL EXAMINATION:  Not performed today as it was a phone only visit  LABORATORY DATA:   I have reviewed the data as listed.  Recent Results (from the past 2160 hours)  Direct antiglobulin test (not at [facility])     Status: None   Collection Time: 05/06/24  3:02 PM  Result Value Ref Range   DAT, complement NEG    DAT, IgG      NEG Performed at [facility], [address], [place], [place] [address]   Haptoglobin     Status: Abnormal   Collection Time: 05/06/24  3:03 PM  Result Value Ref Range   Haptoglobin 374 (H) 38 - 329 mg/dL    Comment: (NOTE) Performed At: [facility] [address] [place], [place] [id number] Jennette Shorter MD Ey:[id number]   Kappa/lambda light chains     Status: Abnormal   Collection Time: 05/06/24  3:03 PM  Result Value Ref Range   Kappa free light chain 37.7 (H) 3.3 - 19.4 mg/L   Lambda free light chains 26.0 5.7 - 26.3 mg/L   Kappa, lambda light chain ratio 1.45 0.26 - 1.65    Comment: (NOTE) Performed At: [facility]  [place] [address] [place], [place] [id number] Jennette Shorter MD Ey:[id number]   Multiple Myeloma Panel (SPEP&IFE w/QIG)     Status: None   Collection Time: 05/06/24  3:03 PM  Result Value Ref Range   IgG (Immunoglobin G), Serum 964 603 - 1,613 mg/dL   IgA 808 61 - 562 mg/dL   IgM (Immunoglobulin M), Srm 80 15 - 143 mg/dL   Total Protein ELP 6.5 6.0 - 8.5 g/dL   Albumin SerPl Elph-Mcnc 3.3 2.9 - 4.4 g/dL   Alpha 1 0.4 0.0 - 0.4 g/dL   Alpha2 Glob SerPl Elph-Mcnc 1.0 0.4 - 1.0 g/dL   B-Globulin SerPl Elph-Mcnc 1.1 0.7 - 1.3 g/dL   Gamma Glob SerPl Elph-Mcnc 0.7 0.4 - 1.8 g/dL   M Protein SerPl Elph-Mcnc Not Observed Not Observed g/dL   Globulin, Total 3.2 2.2 - 3.9 g/dL   Albumin/Glob  SerPl 1.1 0.7 - 1.7   IFE 1 Comment     Comment: (NOTE) The immunofixation pattern appears unremarkable. Evidence of monoclonal protein is not apparent.    Please Note Comment     Comment: (NOTE) Protein electrophoresis scan will follow via computer, mail, or courier delivery. Performed At: [facility] [address] [place], [place] [id number] Jennette Shorter MD Ey:[id number]   Vitamin B12     Status: None   Collection Time: 05/06/24  3:03 PM  Result Value Ref Range   Vitamin B-12 669 180 - 914 pg/mL    Comment: Performed at [facility], [address] [place], [place] [address]  Iron and Iron Binding Capacity ([facility] only)     Status: Abnormal   Collection Time: 05/06/24  3:03 PM  Result Value Ref Range   Iron 58 45 - 182 ug/dL   TIBC 769 (L) 749 - 549 ug/dL   Saturation Ratios 25 17.9 - 39.5 %   UIBC 172 ug/dL    Comment: Performed at [facility], [address], [place], [place] [address]  Lactate dehydrogenase     Status: None   Collection Time: 05/06/24  3:03 PM  Result Value Ref Range   LDH 182 105 - 235 U/L    Comment: Performed at [facility], [address], [place], [place] [address]  CMP (Cancer  Center only)     Status: Abnormal   Collection Time: 05/06/24  3:03 PM  Result Value Ref Range   Sodium 137 135 - 145 mmol/L   Potassium 4.3 3.5 - 5.1 mmol/L   Chloride 104 98 - 111 mmol/L   CO2 22 22 - 32 mmol/L   Glucose, Bld 99 70 - 99 mg/dL    Comment: Glucose reference range applies only to samples taken after fasting for at least 8 hours.   BUN 34 (H) 8 - 23 mg/dL   Creatinine 7.74 (H) 9.38 - 1.24 mg/dL   Calcium 9.4 8.9 - 89.6 mg/dL   Total Protein 7.3 6.5 - 8.1 g/dL   Albumin 3.9 3.5 - 5.0 g/dL   AST 26 15 - 41 U/L   ALT 27 0 - 44 U/L   Alkaline Phosphatase 97 38 - 126 U/L   Total Bilirubin 0.2 0.0 - 1.2 mg/dL   GFR, Estimated 27 (L) >60 mL/min    Comment: (NOTE) Calculated using the CKD-EPI Creatinine Equation (2021)    Anion gap 11 5 - 15    Comment: Performed at [facility], [address] [place], [place] [address]  CBC with Differential (Cancer Center Only)     Status: Abnormal   Collection Time: 05/06/24  3:03 PM  Result Value Ref Range   WBC Count 7.5 4.0 - 10.5 K/uL   RBC 3.43 (L) 4.22 - 5.81 MIL/uL   Hemoglobin 11.1 (L) 13.0 - 17.0 g/dL   HCT 67.2 (L) 60.9 - 47.9 %   MCV 95.3 80.0 - 100.0 fL   MCH 32.4 26.0 - 34.0 pg   MCHC 33.9 30.0 - 36.0 g/dL   RDW 86.2 88.4 - 84.4 %   Platelet Count 367 150 - 400 K/uL   nRBC 0.0 0.0 - 0.2 %   Neutrophils Relative % 63 %   Neutro Abs 4.7 1.7 - 7.7 K/uL   Lymphocytes Relative 26 %   Lymphs Abs 1.9 0.7 - 4.0 K/uL   Monocytes Relative 7 %   Monocytes Absolute 0.5 0.1 -  1.0 K/uL   Eosinophils Relative 4 %   Eosinophils Absolute 0.3 0.0 - 0.5 K/uL   Basophils Relative 0 %   Basophils Absolute 0.0 0.0 - 0.1 K/uL   Immature Granulocytes 0 %   Abs Immature Granulocytes 0.03 0.00 - 0.07 K/uL    Comment: Performed at [facility], [address], [place], [place] [address]  TSH     Status: None   Collection Time: 05/06/24  3:04 PM  Result Value Ref Range   TSH 3.940 0.350 -  4.500 uIU/mL    Comment: Performed at [facility], [address], [place], [place] [address]  Folate     Status: None   Collection Time: 05/06/24  3:05 PM  Result Value Ref Range   Folate 19.0 >5.9 ng/mL    Comment: Performed at [facility], [address], [place], [place] [address]  Ferritin     Status: Abnormal   Collection Time: 05/06/24  3:05 PM  Result Value Ref Range   Ferritin 718 (H) 24 - 336 ng/mL    Comment: Performed at [facility], [address], [place], [place] [address]     RADIOGRAPHIC STUDIES:  No recent pertinent imaging studies available to review.  Orders Placed This Encounter  Procedures   CBC with Differential (Cancer Center Only)    Standing Status:   Future    Expiration Date:   05/19/2025   CMP (Cancer Center only)    Standing Status:   Future    Expiration Date:   05/19/2025     Future Appointments  Date Time Provider Department Center  07/01/2024  9:20 AM Sherlynn Madden, MD [facility] [address]  08/03/2024  2:30 PM [facility] PHLEBOTOMIST [facility] None  08/03/2024  3:00 PM Itxel Wickard, Chinita, MD [facility] None    This document was completed utilizing speech recognition software. Grammatical errors, random word insertions, pronoun errors, and incomplete sentences are an occasional consequence of this system due to software limitations, ambient noise, and hardware issues. Any formal questions or concerns about the content, text or information contained within the body of this dictation should be directly addressed to the provider for clarification.  "

## 2024-05-23 ENCOUNTER — Encounter: Payer: Self-pay | Admitting: Oncology

## 2024-05-23 NOTE — Assessment & Plan Note (Signed)
 Chronic, mild anemia with hemoglobin currently 10.5 g/dL (previously 87.2 g/dL in 7975), longstanding since at least 2015.   Likely multifactorial, with chronic kidney disease contributing via decreased erythropoietin production.   No evidence of ongoing blood loss or significant symptoms, except for rare dyspnea and fatigue.   Anemia remains stable and not severe enough to require immediate intervention.   ESA therapy is not indicated unless hemoglobin falls below 9 g/dL, as benefit is limited at higher levels.  On his consultation with us  on 05/06/2024, labs actually showed slightly better hemoglobin at 11.5, MCV normal at 95.  White count and platelet count remain within normal limits.  Iron studies showed no evidence of iron deficiency.  Vitamin B12, folic acid , TSH, LDH were all within normal limits.  Coombs test negative.   In the context of CKD, it was important to rule out monoclonal gammopathy and hence we pursued SPEP, IFE, quantitative immunoglobulins, serum free light chains and it was unremarkable without evidence of M spike.  Serum free kappa was slightly increased at 37.1 mg/L, lambda was normal at 26, ratio normal at 1.45.  Overall evidence of monoclonal gammopathy   - Discussed that erythropoietin injections are not indicated unless hemoglobin is <9 g/dL.  - Scheduled follow-up in approximately three months, with earlier review if laboratory results or symptoms warrant.

## 2024-07-01 ENCOUNTER — Ambulatory Visit: Admitting: Sports Medicine

## 2024-08-03 ENCOUNTER — Inpatient Hospital Stay: Admitting: Oncology

## 2024-08-03 ENCOUNTER — Inpatient Hospital Stay
